# Patient Record
Sex: Female | Born: 1935 | Race: Black or African American | Hispanic: No | Marital: Married | Smoking: Never smoker
Health system: Southern US, Community
[De-identification: ages and names within clinical notes are randomized; demographics above are authoritative.]

## PROBLEM LIST (undated history)

## (undated) DIAGNOSIS — M1990 Unspecified osteoarthritis, unspecified site: Secondary | ICD-10-CM

## (undated) DIAGNOSIS — I10 Essential (primary) hypertension: Secondary | ICD-10-CM

## (undated) DIAGNOSIS — E669 Obesity, unspecified: Secondary | ICD-10-CM

## (undated) DIAGNOSIS — I519 Heart disease, unspecified: Secondary | ICD-10-CM

## (undated) DIAGNOSIS — I119 Hypertensive heart disease without heart failure: Secondary | ICD-10-CM

## (undated) DIAGNOSIS — I4510 Unspecified right bundle-branch block: Secondary | ICD-10-CM

## (undated) DIAGNOSIS — R7303 Prediabetes: Secondary | ICD-10-CM

## (undated) HISTORY — DX: Essential (primary) hypertension: I10

## (undated) HISTORY — DX: Heart disease, unspecified: I51.9

## (undated) HISTORY — DX: Prediabetes: R73.03

## (undated) HISTORY — DX: Unspecified right bundle-branch block: I45.10

## (undated) HISTORY — DX: Hypertensive heart disease without heart failure: I11.9

## (undated) HISTORY — DX: Unspecified osteoarthritis, unspecified site: M19.90

## (undated) HISTORY — DX: Obesity, unspecified: E66.9

---

## 1984-02-07 HISTORY — PX: ABDOMINAL HYSTERECTOMY: SHX81

## 1988-09-14 ENCOUNTER — Encounter (INDEPENDENT_AMBULATORY_CARE_PROVIDER_SITE_OTHER): Payer: Self-pay | Admitting: Gastroenterology

## 1997-05-25 ENCOUNTER — Encounter (INDEPENDENT_AMBULATORY_CARE_PROVIDER_SITE_OTHER): Payer: Self-pay | Admitting: *Deleted

## 1997-12-15 ENCOUNTER — Ambulatory Visit (HOSPITAL_COMMUNITY): Admission: RE | Admit: 1997-12-15 | Discharge: 1997-12-15 | Payer: Self-pay | Admitting: Gastroenterology

## 1997-12-15 ENCOUNTER — Encounter (INDEPENDENT_AMBULATORY_CARE_PROVIDER_SITE_OTHER): Payer: Self-pay | Admitting: *Deleted

## 1997-12-15 ENCOUNTER — Encounter: Payer: Self-pay | Admitting: Gastroenterology

## 1998-01-06 ENCOUNTER — Encounter (INDEPENDENT_AMBULATORY_CARE_PROVIDER_SITE_OTHER): Payer: Self-pay | Admitting: *Deleted

## 1998-03-30 ENCOUNTER — Other Ambulatory Visit: Admission: RE | Admit: 1998-03-30 | Discharge: 1998-03-30 | Payer: Self-pay | Admitting: Gynecology

## 1999-05-03 ENCOUNTER — Other Ambulatory Visit: Admission: RE | Admit: 1999-05-03 | Discharge: 1999-05-03 | Payer: Self-pay | Admitting: Gynecology

## 2000-06-25 ENCOUNTER — Other Ambulatory Visit: Admission: RE | Admit: 2000-06-25 | Discharge: 2000-06-25 | Payer: Self-pay | Admitting: Gynecology

## 2001-10-16 ENCOUNTER — Other Ambulatory Visit: Admission: RE | Admit: 2001-10-16 | Discharge: 2001-10-16 | Payer: Self-pay | Admitting: Gynecology

## 2002-06-05 ENCOUNTER — Encounter: Payer: Self-pay | Admitting: Internal Medicine

## 2002-06-05 ENCOUNTER — Encounter: Admission: RE | Admit: 2002-06-05 | Discharge: 2002-06-05 | Payer: Self-pay | Admitting: Internal Medicine

## 2002-07-30 ENCOUNTER — Encounter (INDEPENDENT_AMBULATORY_CARE_PROVIDER_SITE_OTHER): Payer: Self-pay | Admitting: *Deleted

## 2002-09-30 ENCOUNTER — Encounter (INDEPENDENT_AMBULATORY_CARE_PROVIDER_SITE_OTHER): Payer: Self-pay | Admitting: Gastroenterology

## 2002-09-30 DIAGNOSIS — D126 Benign neoplasm of colon, unspecified: Secondary | ICD-10-CM | POA: Insufficient documentation

## 2002-11-10 ENCOUNTER — Encounter: Payer: Self-pay | Admitting: General Surgery

## 2002-11-13 ENCOUNTER — Inpatient Hospital Stay (HOSPITAL_COMMUNITY): Admission: RE | Admit: 2002-11-13 | Discharge: 2002-11-19 | Payer: Self-pay | Admitting: General Surgery

## 2002-11-13 ENCOUNTER — Encounter (INDEPENDENT_AMBULATORY_CARE_PROVIDER_SITE_OTHER): Payer: Self-pay | Admitting: *Deleted

## 2002-11-13 ENCOUNTER — Encounter (INDEPENDENT_AMBULATORY_CARE_PROVIDER_SITE_OTHER): Payer: Self-pay | Admitting: Specialist

## 2002-11-15 ENCOUNTER — Encounter: Payer: Self-pay | Admitting: General Surgery

## 2003-11-26 ENCOUNTER — Encounter (INDEPENDENT_AMBULATORY_CARE_PROVIDER_SITE_OTHER): Payer: Self-pay | Admitting: *Deleted

## 2003-12-08 ENCOUNTER — Ambulatory Visit: Payer: Self-pay | Admitting: Gastroenterology

## 2004-01-19 ENCOUNTER — Ambulatory Visit: Payer: Self-pay | Admitting: Gastroenterology

## 2004-02-07 HISTORY — PX: COLON SURGERY: SHX602

## 2004-02-09 ENCOUNTER — Ambulatory Visit: Payer: Self-pay | Admitting: Gastroenterology

## 2004-08-15 ENCOUNTER — Encounter: Admission: RE | Admit: 2004-08-15 | Discharge: 2004-08-15 | Payer: Self-pay | Admitting: Internal Medicine

## 2005-01-04 ENCOUNTER — Encounter: Admission: RE | Admit: 2005-01-04 | Discharge: 2005-01-04 | Payer: Self-pay | Admitting: Internal Medicine

## 2005-03-27 ENCOUNTER — Encounter: Admission: RE | Admit: 2005-03-27 | Discharge: 2005-03-27 | Payer: Self-pay | Admitting: Family Medicine

## 2006-02-16 ENCOUNTER — Other Ambulatory Visit: Admission: RE | Admit: 2006-02-16 | Discharge: 2006-02-16 | Payer: Self-pay | Admitting: Family Medicine

## 2006-07-18 ENCOUNTER — Ambulatory Visit: Payer: Self-pay | Admitting: Gastroenterology

## 2006-07-18 LAB — CONVERTED CEMR LAB
ALT: 17 units/L (ref 0–40)
Basophils Relative: 1.2 % — ABNORMAL HIGH (ref 0.0–1.0)
Bilirubin, Direct: 0.1 mg/dL (ref 0.0–0.3)
Calcium: 9.9 mg/dL (ref 8.4–10.5)
Creatinine, Ser: 1 mg/dL (ref 0.4–1.2)
Eosinophils Absolute: 0.2 10*3/uL (ref 0.0–0.6)
Eosinophils Relative: 3 % (ref 0.0–5.0)
Glucose, Bld: 76 mg/dL (ref 70–99)
Hemoglobin: 11.5 g/dL — ABNORMAL LOW (ref 12.0–15.0)
Lymphocytes Relative: 35.8 % (ref 12.0–46.0)
MCHC: 34.3 g/dL (ref 30.0–36.0)
MCV: 89.6 fL (ref 78.0–100.0)
Monocytes Absolute: 0.4 10*3/uL (ref 0.2–0.7)
Monocytes Relative: 8.3 % (ref 3.0–11.0)
Neutro Abs: 2.6 10*3/uL (ref 1.4–7.7)
Neutrophils Relative %: 51.7 % (ref 43.0–77.0)
Potassium: 4.2 meq/L (ref 3.5–5.1)
RBC: 3.73 M/uL — ABNORMAL LOW (ref 3.87–5.11)
RDW: 12.6 % (ref 11.5–14.6)
Sodium: 144 meq/L (ref 135–145)

## 2006-07-26 ENCOUNTER — Ambulatory Visit (HOSPITAL_COMMUNITY): Admission: RE | Admit: 2006-07-26 | Discharge: 2006-07-26 | Payer: Self-pay | Admitting: Gastroenterology

## 2006-08-27 ENCOUNTER — Ambulatory Visit: Payer: Self-pay | Admitting: Internal Medicine

## 2007-04-26 HISTORY — PX: OTHER SURGICAL HISTORY: SHX169

## 2007-04-26 HISTORY — PX: US ECHOCARDIOGRAPHY: HXRAD669

## 2007-05-22 DIAGNOSIS — K5900 Constipation, unspecified: Secondary | ICD-10-CM | POA: Insufficient documentation

## 2007-05-22 DIAGNOSIS — K589 Irritable bowel syndrome without diarrhea: Secondary | ICD-10-CM | POA: Insufficient documentation

## 2008-04-30 ENCOUNTER — Telehealth: Payer: Self-pay | Admitting: Internal Medicine

## 2008-05-04 ENCOUNTER — Ambulatory Visit: Payer: Self-pay | Admitting: Internal Medicine

## 2008-05-18 ENCOUNTER — Ambulatory Visit: Payer: Self-pay | Admitting: Internal Medicine

## 2008-05-26 ENCOUNTER — Ambulatory Visit: Payer: Self-pay | Admitting: Internal Medicine

## 2008-06-04 ENCOUNTER — Ambulatory Visit: Payer: Self-pay | Admitting: Internal Medicine

## 2008-06-04 DIAGNOSIS — R1084 Generalized abdominal pain: Secondary | ICD-10-CM | POA: Insufficient documentation

## 2008-06-05 ENCOUNTER — Telehealth (INDEPENDENT_AMBULATORY_CARE_PROVIDER_SITE_OTHER): Payer: Self-pay | Admitting: *Deleted

## 2008-06-05 LAB — CONVERTED CEMR LAB: UREASE: POSITIVE

## 2009-05-18 ENCOUNTER — Telehealth: Payer: Self-pay | Admitting: Internal Medicine

## 2009-05-19 ENCOUNTER — Ambulatory Visit: Payer: Self-pay | Admitting: Gastroenterology

## 2009-05-19 DIAGNOSIS — Z860100 Personal history of colon polyps, unspecified: Secondary | ICD-10-CM | POA: Insufficient documentation

## 2009-05-19 DIAGNOSIS — I2510 Atherosclerotic heart disease of native coronary artery without angina pectoris: Secondary | ICD-10-CM | POA: Insufficient documentation

## 2009-05-19 DIAGNOSIS — I10 Essential (primary) hypertension: Secondary | ICD-10-CM

## 2009-05-19 DIAGNOSIS — R1013 Epigastric pain: Secondary | ICD-10-CM

## 2009-06-17 ENCOUNTER — Telehealth (INDEPENDENT_AMBULATORY_CARE_PROVIDER_SITE_OTHER): Payer: Self-pay | Admitting: *Deleted

## 2010-03-08 NOTE — Assessment & Plan Note (Signed)
Summary: epigastric pain-Dr.Perry pt   History of Present Illness Visit Type: Follow-up Visit Primary GI MD: Yancey Flemings MD Primary Provider: Leland Her, MD Chief Complaint: epigastric pain off/on since EGD History of Present Illness:   76 YO FEMALE  KNOWN TO DR. PERRY WITH HX OF GASTRITIS-H.PYLORI + ONE YEAR AGO. TREATED WITH A PREVPAK. ALSO HAD COLONOSCOPY AT THAT SAME TIME  WHICH WAS NORMAL WITH A FAIR PREP. SHE HAS HX OF LAP RIGHT HEMICOLECTOMY 2004 FOR A LARGE VILLOUS ADENOMA. SHE COMES IN TODAY WITH C/O INTERMITTENT EPIGASTRIC GNAWING AND BURNING OVER THE PAST COUPLE MONTHS. SHE WAS WAKENED FROM SLEEP WITH PAIN YESTERDAY MORNING-ATE VERY BLAND ALL DAY AND FEELS A LITTLE BETTER TODAY. NO N/V. NO CHANGE IN BOWEL HABITS, NO MELENA OR HEME.   GI Review of Systems    Reports abdominal pain and  belching.     Location of  Abdominal pain: epigastric area.    Denies acid reflux, bloating, chest pain, dysphagia with liquids, dysphagia with solids, heartburn, loss of appetite, nausea, vomiting, vomiting blood, and  weight loss.        Denies anal fissure, black tarry stools, change in bowel habit, constipation, diarrhea, diverticulosis, fecal incontinence, heme positive stool, hemorrhoids, irritable bowel syndrome, jaundice, light color stool, liver problems, rectal bleeding, and  rectal pain. Preventive Screening-Counseling & Management  Alcohol-Tobacco     Smoking Status: never      Drug Use:  no.      Current Medications (verified): 1)  Micardis Hct 80-12.5 Mg Tabs (Telmisartan-Hctz) .... Once Daily 2)  Demadex 20 Mg Tabs (Torsemide) .... Once Daily 3)  Cardura 8 Mg Tabs (Doxazosin Mesylate) .... Every Evening 4)  Clonidine Hcl 0.1 Mg Tabs (Clonidine Hcl) .... Every Evening 5)  Glucosamine-Chondroitin-Vit D3 1500-1200-800 Mg-Mg-Unit Pack (Glucosamine-Chondroitin-Vit D3) .... Take 2 Tablets Daily 6)  Fish Oil 1500 Mg .... Take 2 Tablets Daily 7)  Multivitamins  Tabs (Multiple  Vitamin) .... Once Daily 8)  Vitamin C Cr 500 Mg Cr-Tabs (Ascorbic Acid) .... Once Daily 9)  Klor-Con M20 20 Meq Cr-Tabs (Potassium Chloride Crys Cr) .... Once Daily 10)  Calcium 800mg  Plus Magnesium .... Take 2 Tablets Daily 11)  Vitamin D 1000mg  .... Once Daily 12)  Aspir-Low 81 Mg Tbec (Aspirin) .... Once Daily  Allergies (verified): No Known Drug Allergies  Past History:  Past Medical History: Current Problems:  Family Hx of MALIGNANT NEOPLASM OF COLON UNSPECIFIED SITE (ICD-153.9) CONSTIPATION (ICD-564.00) IRRITABLE BOWEL SYNDROME (ICD-564.1) TUBULOVILLOUS ADENOMA, COLON (ICD-211.3)-REQUIRED SURGICAL REMOVAL-RIGHT HEMICOLECTOMY 2004 H.PYLORI GASTRITIS 2010  Past Surgical History: Hysterectomy Appendectomy LAP RIGHT HEMICOLECTOMY 2004-LARGE VILLOUS ADENOMA  Family History: Family History of Colon Cancer:Sister  Social History: Occupation:  Patient has never smoked.  Alcohol Use - yes Daily Caffeine Use Illicit Drug Use - no Smoking Status:  never Drug Use:  no  Review of Systems       The patient complains of arthritis/joint pain.  The patient denies allergy/sinus, anemia, back pain, blood in urine, breast changes/lumps, change in vision, confusion, cough, coughing up blood, depression-new, fainting, fatigue, fever, headaches-new, hearing problems, heart murmur, heart rhythm changes, itching, menstrual pain, muscle pains/cramps, night sweats, nosebleeds, pregnancy symptoms, shortness of breath, skin rash, sleeping problems, sore throat, swelling of feet/legs, swollen lymph glands, thirst - excessive, urination - excessive, and urination changes/pain.         OTHERWISE AS IN HPI  Vital Signs:  Patient profile:   76 year old female Height:      72  inches Weight:      192.25 pounds BMI:     32.11 Pulse rate:   68 / minute Pulse rhythm:   regular BP sitting:   120 / 74  (left arm) Cuff size:   regular  Vitals Entered By: June McMurray CMA Duncan Dull) (May 19, 2009  10:28 AM)  Physical Exam  General:  Well developed, well nourished, no acute distress. Head:  Normocephalic and atraumatic. Eyes:  PERRLA, no icterus. Lungs:  Clear throughout to auscultation. Heart:  Regular rate and rhythm; no murmurs, rubs,  or bruits. Abdomen:  SOFT, MINIMALLY TENDER EPIGASTRIUM,NO GUARDING, NO REBOUND ,NO MASS OR HSM,BS+ Rectal:  NOT DONE Extremities:  No clubbing, cyanosis, edema or deformities noted. Neurologic:  Alert and  oriented x4;  grossly normal neurologically. Psych:  Alert and cooperative. Normal mood and affect.   Impression & Recommendations:  Problem # 1:  ABDOMINAL PAIN-EPIGASTRIC (ICD-789.06) Assessment New 76 YO FEMALE  WITH 2 MONTH HX OF EPIGASTRIC DISCOMFORT,BURNING,GNAWING-WORSE PAST COUPLE DAYS-SXS CONSITENT WITH GASTRITIS-PROBABLY RELATED TO INTERMITTENT NSAIDS AND DAILY BABY ASA WHICH SHE HAS BEEN TAKING AT NIGHT.  STOP IBUPROFEN TAKE ASA IN THE MORNING WITH FOOD START ACIPHEX 20 MG DAILY IN AM-SAMPLES GIVEN-IF INSURANCE WILL NOT COVER CAN CONTINUE WITH OMEPRAZOLE 20 MG DAILY IN AM. FOLLOW UP WITH DR. PERRY IN 3-4 WEEKS IF SXS ARE PERSISTING.  Problem # 2:  FAMILY HX COLON CANCER (ICD-V16.0) Assessment: Comment Only NORMAL COLON 4/10  Problem # 3:  PERSONAL HX COLONIC POLYPS (ICD-V12.72) Assessment: Unchanged LARGE TUBOVILLOUS POLYP REMOVED WITH RIGHT HEMICOLECTOMY  2004.  Problem # 4:  HYPERTENSION (ICD-401.9)  Patient Instructions: 1)  We have given you samples of Aciphex . Take 1 tab 30 min before breakfast.  Due to cost, the Aciphex is not preferred with your Insurance plan so you can get Over the Counter Prilosec at the pharmacy or [facility] to take 1 daily before breakfast. 2)  We made you an appointment with Dr. Marina Goodell for 06-21-09 at 4:00 PM.   3)  Copy sent to :  Leland Her, MD 4)  The medication list was reviewed and reconciled.  All changed / newly prescribed medications were explained.  A complete medication list was  provided to the patient / caregiver.

## 2010-03-08 NOTE — Progress Notes (Signed)
Summary: Aciphex worked Water quality scientist good.  Phone Note Call from Patient Call back at Home Phone [phone]   Call For: Dr Marina Goodell Summary of Call: Cancelling her appoinment on Mon 06-21-09 because the aciphex worked wonderful and has not had a problem since. Is very happy and wanted to take a minute and let Doctor know. Initial call taken by: Leanor Kail [facility],  Jun 17, 2009 11:43 AM  Follow-up for Phone Call        Pt. has not had any epigastric pain since Aciphex started.Instructed to call back if  symptoms re-occur.Otherwise she will come for yearly check-up.  Follow-up by: Teryl Lucy RN,  Jun 17, 2009 1:53 PM  Additional Follow-up for Phone Call Additional follow up Details #1::        ok Additional Follow-up by: Hilarie Fredrickson MD,  Jun 21, 2009 7:54 AM

## 2010-03-08 NOTE — Progress Notes (Signed)
Summary: triage / abdominal pain  Phone Note Call from Patient Call back at [phone]   Caller: Patient Call For: Dr. Marina Goodell Reason for Call: Talk to Nurse Complaint: Breathing Problems Summary of Call: "my stomach is hurting so"... Sunday abd felt "numb" and last night abd started hurting Initial call taken by: Vallarie Mare,  May 18, 2009 12:27 PM  Follow-up for Phone Call        Started yesterday with burning epigastric pain which during the night was a level 6 and is now a level 4. Improves with soft bland foods but aggravated by  fresh vegetables and fruits only other symptom is belching. Follow-up by: Teryl Lucy RN,  May 18, 2009 12:37 PM  Additional Follow-up for Phone Call Additional follow up Details #1::        start her on a daily PPI and set her up to see the extender this week. If her problem worsens significantly in the interim, she should go to the ER Additional Follow-up by: Hilarie Fredrickson MD,  May 18, 2009 2:26 PM    Additional Follow-up for Phone Call Additional follow up Details #2::    Pt. given appt. with tomorrow am will discuss medication then. Follow-up by: Teryl Lucy RN,  May 18, 2009 3:18 PM

## 2010-03-08 NOTE — Progress Notes (Signed)
Summary: [employer] [employer]   Imported By: Sherian Rein 06/22/2009 10:57:20  _____________________________________________________________________  External Attachment:    Type:   Image     Comment:   External Document

## 2010-03-08 NOTE — Progress Notes (Signed)
Summary: [facility] [employer]   Imported By: Sherian Rein 06/22/2009 10:53:35  _____________________________________________________________________  External Attachment:    Type:   Image     Comment:   External Document

## 2010-03-11 NOTE — Progress Notes (Signed)
Summary: [facility] [employer]   Imported By: Sherian Rein 06/22/2009 10:56:09  _____________________________________________________________________  External Attachment:    Type:   Image     Comment:   External Document

## 2010-03-11 NOTE — Progress Notes (Signed)
Summary: [facility] [employer] [facility]   Imported By: Sherian Rein 06/22/2009 10:54:47  _____________________________________________________________________  External Attachment:    Type:   Image     Comment:   External Document

## 2010-06-21 NOTE — Assessment & Plan Note (Signed)
[facility]                         GASTROENTEROLOGY OFFICE NOTE   NAME:Kaitlin [name]                          MRN:          [id number]  DATE:08/27/2006                            DOB:          January 14, 1935    HISTORY OF PRESENT ILLNESS:  Kaitlin [name] is a 76 year old female who has  been followed by Dr. Victorino Dike for constipation predominant irritable  bowel syndrome and colon polyps.  She has known Dr. Corinda Gubler for many  years.  The last evaluation with him was July 18, 2006.  She is status  post right hemicolectomy for large adenomatous colon polyp which not  amenable to endoscopic removal.  She does have family history of colon  cancer.  Her resection was in 2004.  Her last colonoscopy was in 2006  and was negative except for diminutive polyps.  Follow up in four years  recommended.  At the time of her recent visit with Dr.  Corinda Gubler, she was  felt to have abdominal discomfort due to chronic constipation as well as  significant anxiety over her symptoms.  Because of this, he ordered a CT  scan of the abdomen and pelvis.  This was essentially normal.  They did  raise the question of SMA syndrome.  However, this is a well-nourished,  well-developed female with no weight loss or obstructive symptoms.  Dr.  Corinda Gubler recommended treating her with five prunes per day.  Since she  has done this, her bowel habits have been regular and her symptoms have  improved.  She is also relieved that a CT scan was unrevealing.   CURRENT MEDICATIONS:  As listed in the chart.  These have been reviewed.   PHYSICAL EXAMINATION:  GENERAL:  Well-appearing female in no acute  distress.  VITAL SIGNS:  Blood pressure 122/84, heart rate 72, weight 195 pounds.  She is 5 feet 5 inches in height.  HEENT:  Sclerae anicteric.  LUNGS:  Clear.  HEART:  Regular.  ABDOMEN:  Soft without tenderness, mass or hernia.  Good bowel sounds.  EXTREMITIES:  Without edema.   IMPRESSION:  1.  Constipation predominant irritable bowel syndrome.  2. History of large adenomatous polyp of the right colon status post      right hemicolectomy.  3. General medical problems under the care of Dr. Tiburcio Pea.   RECOMMENDATIONS:  1. Continue prunes for bowels.  2. Follow up colonoscopy planned in January 2010.  3. Interval GI followup p.r.n.     Wilhemina Bonito. Marina Goodell, MD  Electronically Signed    JNP/MedQ  DD: 08/27/2006  DT: 08/27/2006  Job #: [id number]   cc:   Holley Bouche, M.D.

## 2010-06-21 NOTE — Assessment & Plan Note (Signed)
[facility]                         GASTROENTEROLOGY OFFICE NOTE   NAME:Kaitlin [name], Kaitlin [name]                          MRN:          [id number]  DATE:07/18/2006                            DOB:          01-16-1935    Ache in the lower abdomen, mainly in the mornings.  Yearly checkup.  History of multiple operations in the past.  Has a slow, constant aching  since her colectomy.  Says she has difficulty with bowel activities.  Her symptoms are thought related to constipation.  Her last colonoscopic  examination was in January 2006.  She did have multiple very small,  diminutive polyps, nothing else of significance.  She has a family  history of colon cancer and I think this worries her, as well as she has  had a segmental resection of her right colon for villous adenoma in this  region.  She is anxious about all this and I think this is what  precipitated her visit.   In any case, on physical examination, she weighed 188, blood pressure  110/68, pulse 60 and regular.  Neck, heart and extremities is  unremarkable.   IMPRESSION:  Status post colon polyps as described with one removed at  segmental colectomy.   RECOMMENDATIONS:  CT of her abdomen and pelvis to be complete or to try  to explain her symptoms or reason that she would not have to worry or  for reassurance because of the chronic pain that she describes.  Then  she can follow with one of my colleagues after my retirement.     Ulyess Mort, MD  Electronically Signed    SML/MedQ  DD: 07/18/2006  DT: 07/18/2006  Job #: [id number]

## 2010-06-24 NOTE — Op Note (Signed)
NAME:  Kaitlin [name], Kaitlin [name]                             ACCOUNT NO.:  [id number]   MEDICAL RECORD NO.:  [id number]                   PATIENT TYPE:  INP   LOCATION:  X001                                 FACILITY:  [facility]   PHYSICIAN:  Adolph Pollack, M.D.            DATE OF BIRTH:  Aug 26, 1934   DATE OF PROCEDURE:  11/13/2002  DATE OF DISCHARGE:                                 OPERATIVE REPORT   PREOPERATIVE DIAGNOSIS:  Large tubulovillous adenoma of right colon.   POSTOPERATIVE DIAGNOSIS:  Large tubulovillous adenoma of right colon.   PROCEDURE:  Laparoscopic right colectomy.   SURGEON:  Adolph Pollack, M.D.   ASSISTANT:  Gabrielle Dare. Janee Morn, M.D.   ANESTHESIA:  General.   INDICATIONS FOR PROCEDURE:  Kaitlin [name] is a 76 year old female whose sister  was recently diagnosed with colon cancer. She had a screening colonoscopy by  Dr. Corinda Gubler and a large polypoid lesion was seen near the cecum and proximal  right colon. It could not be removed colonoscopically. Biopsy demonstrated  tubulovillous adenoma and she now presents for partial colectomy. The  procedure and the risks were explained to her preoperatively.   TECHNIQUE:  She is seen in the holding area and then brought to the  operating room, placed supine on the operating table and a general  anesthetic was administered. A Foley catheter was placed in the bladder.  Her abdomen was sterilely prepped and draped. A small incision was made in  the epigastrium and carried down through the subcutaneous tissue and a small  incision made in the fascia and peritoneal cavity. A pursestring suture of  #0 Vicryl was placed around the fascial area. A Hasson trocar was introduced  into the peritoneal cavity and pneumoperitoneum created by insufflation of  CO2 gas.   Next, a laparoscope was introduced and under direct vision a 10 mm trocar  was placed in the left mid abdomen mid clavicular line. A 5 mm trocar was  then placed in the lower  midline. I grasped the right colon and using the  harmonic scalpel, I divided the white line of Toldt and began medializing  the right colon. I identified the area of the ureter and kept the dissection  above this. The terminal ileum was fairly mobile. I then used the harmonic  scalpel to divide the attachments in the hepatic flexure and proximal  transverse colon and mobilized this. By the time I had this mobilized, I  could retract this all the way down to the pelvis. I then made an extraction  incision through a previous Pfannenstiel incision using only part of it  entering the peritoneal cavity. I was able to grasp the cecum in the distal  ileum and bring all this out including the proximal through the transverse  colon. I divided the distal ileum with the GIA stapler and then divided the  transverse colon at its junction  with the proximal 1/3 and distal 2/3 which  was proximal to the middle colic vessels. Once I had done this, I then  divided the mesentery in a wedge shape to include lymph nodes and vessels  and ligated the vessels with ties. I took the specimen off the field, opened  it up and found a large tubulovillous lesion in the proximal right colon.   Next gloves were change. I then approximated two ends of the bowel together  in a side by side fashion and performed a side by side stapled anastomosis.  I closed the remaining enterotomy with a TA60 noncutting stapler. The  anastomosis was patent, viable and under no tension. The distal most staple  line anteriorly was reinforced with a 2-0 Vicryl suture. The anastomosis was  then dropped back into the abdominal cavity.   Next, I irrigated out the area with the warm normal saline solution. I then  closed the peritoneum with a running 2-0 Vicryl suture. The fascia was  closed with a running #1 PDS suture and the subcutaneous tissue was  irrigated.   The abdomen was reinsufflated and the laparoscope reintroduced. There was  one  area at the mesentery that I had identified as possibly having a vessel  was not ligated well. I identified this by way of the suture we used and  then used a Vicryl endoloop to further ligate this area. I then removed the  suture. Hemostasis was adequate and anastomosis was patent. I saw no liver  lesions and no suspicious lesions in the pelvis. The fascial closure was  found to be solid inferiorly.   The trocars were then removed, the epigastric fascial defect closed by  tightening up and tying down the pursestring suture and the skin incisions  closed with staples. Sterile dressings were applied.   She tolerated the procedure well without any apparent complications. She  subsequently was taken to the recovery room, extubated and in satisfactory  condition.                                               Adolph Pollack, M.D.    Kari Baars  D:  11/13/2002  T:  11/13/2002  Job:  [id number]   cc:   Sharlet Salina, M.D.  [address]  [place]  [place] [address]  Fax: [phone]   Ulyess Mort, M.D. [facility]   Darlin Priestly, M.D.  [address], [address]  [place]  [place] [address]  Fax: [phone]

## 2010-06-24 NOTE — Discharge Summary (Signed)
   NAME:  Kaitlin [name], Kaitlin [name]                             ACCOUNT NO.:  [id number]   MEDICAL RECORD NO.:  [id number]                   PATIENT TYPE:  INP   LOCATION:  0365                                 FACILITY:  [facility]   PHYSICIAN:  Adolph Pollack, M.D.            DATE OF BIRTH:  10/25/1934   DATE OF ADMISSION:  11/13/2002  DATE OF DISCHARGE:  11/19/2002                                 DISCHARGE SUMMARY   PRINCIPAL DISCHARGE DIAGNOSIS:  Tubulovillous adenoma of the right colon.   SECONDARY DIAGNOSES:  1. Hypertension.  2. Arthritis.  3. Congestive heart failure.   PROCEDURE:  Laparoscopic right colectomy November 13, 2002.   HISTORY:  Ms. Kaitlin [name] is a 76 year old female with a family history of colon  cancer.  Colon cancer screening by way of colonoscopy was done and a villous  type polypoid lesion was found in the cecal area.  It could not be  completely removed.  She is admitted for elective resection.   HOSPITAL COURSE:  She underwent a laparoscopic right colectomy.  Postoperatively, she had some mild congestive heart failure which was  treated with adequate diuresis.  She had return of some bowel function and  was started on a liquid-type diet.  Hypertension was controlled with her  home antihypertensives.  She had some coughing and was given some  Robitussin; this controlled her cough well.  She also had some hypokalemia  following her diuresis but this was replaced with potassium.   By postoperative day #6, she was feeling much better and her cough was  resolved.  Bowels were moving, she is tolerating a diet, congestive heart  failure was resolved, her staples were removed, and she was ready for  discharge.    DISPOSITION:  Discharged to home in satisfactory condition on November 19, 2002.  She is told to continue her home medications and take Tylox for pain.  Activity restrictions and dietary recommendations were given to her.  She  will return to see me in 2-3 weeks in the  office for followup.  Of note was  that the pathology was consistent with a benign colonic tubulovillous  adenoma on final report.                                               Adolph Pollack, M.D.    Kari Baars  D:  11/28/2002  T:  11/28/2002  Job:  [id number]   cc:   Ulyess Mort, M.D. [facility]   Sharlet Salina, M.D.  [address]  [place]  [place] [address]  Fax: [phone]   Darlin Priestly, M.D.  [phone] [address], [address]  [place]  [place] [address]  Fax: [phone]

## 2010-07-18 ENCOUNTER — Telehealth: Payer: Self-pay | Admitting: Internal Medicine

## 2010-07-18 MED ORDER — OMEPRAZOLE 20 MG PO CPDR: 20.0000 mg | DELAYED_RELEASE_CAPSULE | Freq: Two times a day (BID) | ORAL | Status: DC

## 2010-07-18 NOTE — Telephone Encounter (Signed)
Pt aware of Dr. Lamar Sprinkles recommendations and rx sent to the pharmacy.

## 2010-07-18 NOTE — Telephone Encounter (Signed)
Left message for pt to call back  °

## 2010-07-18 NOTE — Telephone Encounter (Signed)
Pt states she is having epigastric pain like she has had in the past. Pt states it is worse at night and that it wakes her up. She describes it as an irritation. Pt was given Aciphex 20mg  in the past for this along with Omeprazole 20mg  in the am. Dr. Marina Goodell please advise.

## 2010-07-18 NOTE — Telephone Encounter (Signed)
She can increase omeprazole to twice a day. If symptoms persist despite this, she may need upper endoscopy

## 2011-02-07 HISTORY — PX: CATARACT EXTRACTION W/ INTRAOCULAR LENS  IMPLANT, BILATERAL: SHX1307

## 2011-03-06 ENCOUNTER — Other Ambulatory Visit: Payer: Self-pay | Admitting: Family Medicine

## 2011-03-06 ENCOUNTER — Ambulatory Visit
Admission: RE | Admit: 2011-03-06 | Discharge: 2011-03-06 | Disposition: A | Discharge status: Home / Self Care | Payer: Medicare Other | Source: Ambulatory Visit | Attending: Family Medicine | Admitting: Family Medicine

## 2011-03-06 DIAGNOSIS — I10 Essential (primary) hypertension: Secondary | ICD-10-CM

## 2011-03-15 ENCOUNTER — Encounter: Payer: Self-pay | Admitting: Internal Medicine

## 2011-06-02 ENCOUNTER — Encounter: Payer: Self-pay | Admitting: Internal Medicine

## 2011-06-19 ENCOUNTER — Ambulatory Visit (AMBULATORY_SURGERY_CENTER): Payer: Medicare Other | Admitting: *Deleted

## 2011-06-19 VITALS — Ht 65.0 in | Wt 195.0 lb

## 2011-06-19 DIAGNOSIS — Z1211 Encounter for screening for malignant neoplasm of colon: Secondary | ICD-10-CM

## 2011-06-19 DIAGNOSIS — Z86010 Personal history of colonic polyps: Secondary | ICD-10-CM

## 2011-06-19 MED ORDER — PEG-KCL-NACL-NASULF-NA ASC-C 100 G PO SOLR: ORAL | Status: DC

## 2011-07-10 ENCOUNTER — Encounter: Payer: Self-pay | Admitting: Internal Medicine

## 2011-07-10 ENCOUNTER — Ambulatory Visit (AMBULATORY_SURGERY_CENTER): Payer: Medicare Other | Admitting: Internal Medicine

## 2011-07-10 VITALS — BP 147/85 | HR 54 | Temp 97.3°F | Resp 20 | Ht 65.0 in | Wt 195.0 lb

## 2011-07-10 DIAGNOSIS — Z800 Family history of malignant neoplasm of digestive organs: Secondary | ICD-10-CM

## 2011-07-10 DIAGNOSIS — Z1211 Encounter for screening for malignant neoplasm of colon: Secondary | ICD-10-CM

## 2011-07-10 DIAGNOSIS — Z86010 Personal history of colonic polyps: Secondary | ICD-10-CM

## 2011-07-10 MED ORDER — SODIUM CHLORIDE 0.9 % IV SOLN: 500.0000 mL | INTRAVENOUS | Status: DC

## 2011-07-10 NOTE — Patient Instructions (Signed)
YOU HAD AN ENDOSCOPIC PROCEDURE TODAY AT THE [facility]: Refer to the procedure report that was given to you for any specific questions about what was found during the examination.  If the procedure report does not answer your questions, please call your gastroenterologist to clarify.  If you requested that your care partner not be given the details of your procedure findings, then the procedure report has been included in a sealed envelope for you to review at your convenience later.  YOU SHOULD EXPECT: Some feelings of bloating in the abdomen. Passage of more gas than usual.  Walking can help get rid of the air that was put into your GI tract during the procedure and reduce the bloating. If you had a lower endoscopy (such as a colonoscopy or flexible sigmoidoscopy) you may notice spotting of blood in your stool or on the toilet paper. If you underwent a bowel prep for your procedure, then you may not have a normal bowel movement for a few days.  DIET: Your first meal following the procedure should be a light meal and then it is ok to progress to your normal diet.  A half-sandwich or bowl of soup is an example of a good first meal.  Heavy or fried foods are harder to digest and may make you feel nauseous or bloated.  Likewise meals heavy in dairy and vegetables can cause extra gas to form and this can also increase the bloating.  Drink plenty of fluids but you should avoid alcoholic beverages for 24 hours.  ACTIVITY: Your care partner should take you home directly after the procedure.  You should plan to take it easy, moving slowly for the rest of the day.  You can resume normal activity the day after the procedure however you should NOT DRIVE or use heavy machinery for 24 hours (because of the sedation medicines used during the test).    SYMPTOMS TO REPORT IMMEDIATELY: A gastroenterologist can be reached at any hour.  During normal business hours, 8:30 AM to 5:00 PM Monday through Friday,  call [phone].  After hours and on weekends, please call the GI answering service at [phone] who will take a message and have the physician on call contact you.   Following lower endoscopy (colonoscopy or flexible sigmoidoscopy):  Excessive amounts of blood in the stool  Significant tenderness or worsening of abdominal pains  Swelling of the abdomen that is new, acute  Fever of 100F or higher  FOLLOW UP: If any biopsies were taken you will be contacted by phone or by letter within the next 1-3 weeks.  Call your gastroenterologist if you have not heard about the biopsies in 3 weeks.  Our staff will call the home number listed on your records the next business day following your procedure to check on you and address any questions or concerns that you may have at that time regarding the information given to you following your procedure. This is a courtesy call and so if there is no answer at the home number and we have not heard from you through the emergency physician on call, we will assume that you have returned to your regular daily activities without incident.  SIGNATURES/CONFIDENTIALITY: You and/or your care partner have signed paperwork which will be entered into your electronic medical record.  These signatures attest to the fact that that the information above on your After Visit Summary has been reviewed and is understood.  Full responsibility of the confidentiality of this   discharge information lies with you and/or your care-partner.  Resume medications. 

## 2011-07-10 NOTE — Progress Notes (Signed)
Patient did not experience any of the following events: a burn prior to discharge; a fall within the facility; wrong site/side/patient/procedure/implant event; or a hospital transfer or hospital admission upon discharge from the facility. (G8907) Patient did not have preoperative order for IV antibiotic SSI prophylaxis. (G8918)  

## 2011-07-10 NOTE — Op Note (Signed)
[facility] [address] [place], [place]  [address]  COLONOSCOPY PROCEDURE REPORT  PATIENT:  Kaitlin [name], Kaitlin [name]  MR#:  [id number] BIRTHDATE:  [birth year], 76 yrs. old  GENDER:  female ENDOSCOPIST:  Wilhemina Bonito. Eda Keys, MD REF. BY:  Surveillance Program Recall, PROCEDURE DATE:  07/10/2011 PROCEDURE:  Surveillance Colonoscopy ASA CLASS:  Class II INDICATIONS:  history of pre-cancerous (adenomatous) colon polyps, family history of colon cancer, surveillance and high-risk screening ; laproscopic assisted right hemicolectomy 2004 for large TVA; f/u 2006,2010 MEDICATIONS:   MAC sedation, administered by CRNA, propofol (Diprivan) 220 mg IV  DESCRIPTION OF PROCEDURE:   After the risks benefits and alternatives of the procedure were thoroughly explained, informed consent was obtained.  Digital rectal exam was performed and revealed no abnormalities.   The LB CF-[id number] endoscope was introduced through the anus and advanced to the anastomosis, without limitations.  The quality of the prep was good, using MoviPrep.  The instrument was then slowly withdrawn as the colon was fully examined. <<PROCEDUREIMAGES>>  FINDINGS:  The right colon was surgically resected and an ileo-colonic anastamosis was seen.  Otherwise normal colonoscopy without other polyps, masses, vascular ectasias, or inflammatory changes.   Retroflexed views in the rectum revealed no abnormalities.    The time to anastomosis   = 4:17  minutes. The scope was then withdrawn in   7:19  minutes  and the procedure completed.  COMPLICATIONS:  None  ENDOSCOPIC IMPRESSION: 1) Prior right hemi-colectomy 2) Otherwise normal colonoscopy  RECOMMENDATIONS: 1) Follow up colonoscopy in 5 years  ______________________________ Wilhemina Bonito. Eda Keys, MD  CC:  The Patient;   Holley Bouche, MD  n. eSIGNED:   Wilhemina Bonito. Eda Keys at 07/10/2011 11:58 AM  Milda Smart, [phone]

## 2011-07-11 ENCOUNTER — Telehealth: Payer: Self-pay | Admitting: *Deleted

## 2011-07-11 NOTE — Telephone Encounter (Signed)
  Follow up Call-  Call back number 07/10/2011  Post procedure Call Back phone  # [phone]  Permission to leave phone message Yes     Patient questions:  Do you have a fever, pain , or abdominal swelling? no Pain Score  0 *  Have you tolerated food without any problems? yes  Have you been able to return to your normal activities? yes  Do you have any questions about your discharge instructions: Diet   no Medications  no Follow up visit  no  Do you have questions or concerns about your Care? no  Actions: * If pain score is 4 or above: No action needed, pain <4.

## 2012-05-18 ENCOUNTER — Encounter: Payer: Self-pay | Admitting: *Deleted

## 2012-05-20 ENCOUNTER — Encounter: Payer: Self-pay | Admitting: Cardiovascular Disease

## 2012-05-21 ENCOUNTER — Other Ambulatory Visit: Payer: Self-pay | Admitting: Family Medicine

## 2012-05-21 DIAGNOSIS — M549 Dorsalgia, unspecified: Secondary | ICD-10-CM

## 2012-05-29 ENCOUNTER — Ambulatory Visit
Admission: RE | Admit: 2012-05-29 | Discharge: 2012-05-29 | Disposition: A | Discharge status: Home / Self Care | Payer: Medicare Other | Source: Ambulatory Visit | Attending: Family Medicine | Admitting: Family Medicine

## 2012-05-29 DIAGNOSIS — M549 Dorsalgia, unspecified: Secondary | ICD-10-CM

## 2013-07-16 ENCOUNTER — Telehealth: Payer: Self-pay | Admitting: Cardiovascular Disease

## 2013-07-16 NOTE — Telephone Encounter (Signed)
New Prob    Pt is requesting to speak to a nurse. She did not leave any details regarding call.

## 2013-07-16 NOTE — Telephone Encounter (Signed)
LMTCB

## 2013-09-11 ENCOUNTER — Ambulatory Visit (INDEPENDENT_AMBULATORY_CARE_PROVIDER_SITE_OTHER): Payer: Medicare Other | Admitting: Cardiovascular Disease

## 2013-09-11 ENCOUNTER — Encounter: Payer: Self-pay | Admitting: Cardiovascular Disease

## 2013-09-11 VITALS — BP 150/84 | HR 58 | Resp 16 | Ht 65.0 in | Wt 197.6 lb

## 2013-09-11 DIAGNOSIS — I10 Essential (primary) hypertension: Secondary | ICD-10-CM

## 2013-09-11 NOTE — Assessment & Plan Note (Addendum)
Control is excellent. She is on an unusual combination of alpha blocker and clonidine in addition to diuretics and ARB. This has worked well for her. Avoid calcium channel blockers since these may worsen her edema. Avoid beta blockers do to baseline bradycardia and right bundle branch block. I think her lower showed edema is due to a combination of venous insufficiency and possibly mild diastolic dysfunction. She is encouraged to lose weight and avoid excessive consumption of sweets and starches since she has borderline diabetes mellitus. She has excellent tolerance to physical activity by her report.

## 2013-09-11 NOTE — Progress Notes (Signed)
Patient ID: Kaitlin [name], female   DOB: 01/14/1935, 79 y.o.   MRN: [id number]      Reason for office visit Hypertensive heart disease, mild diastolic dysfunction, venous insufficiency  Kaitlin [name] is a 79 year old woman with long-standing systemic hypertension, left ventricular hypertrophy, mild diastolic dysfunction, chronic right bundle-branch block,obesity, borderline diabetes mellitus and chronic complaints of lower extremity edema related in part to venous insufficiency. Her last echocardiogram was performed in 2009 it showed normal LV ejection fraction and impaired left ventricular relaxation. The left atrium is mildly dilated. She had a nuclear stress test performed in 2009 with normal results.  She is very active. She was recently on a drip to [place] with her daughter was able to walk 15 blocks without stopping. She did notice swelling in her legs with prolonged orthostasis, but not while she was walking. She has been on the same antihypertensive medication with good results for years. Her typical blood pressure at home is in the 130s over 70s. She had blood work done with her primary care physician, Dr. Mickel Crow earlier this spring. The results were good except for borderline elevation in hemoglobin A1c. She denies problems with shortness of breath with activity, chest discomfort, palpitations or dizziness.  No Known Allergies  Current Outpatient Prescriptions  Medication Sig Dispense Refill  . aspirin 81 MG tablet Take 81 mg by mouth daily.      . cloNIDine (CATAPRES) 0.1 MG tablet Take 1 tablet by mouth Daily.      [name] doxazosin (CARDURA) 8 MG tablet Take 1 tablet by mouth at bedtime.      [name] KLOR-CON M20 20 MEQ tablet Take 1 tablet by mouth Daily.      [name] MICARDIS HCT 80-12.5 MG per tablet Take 1 tablet by mouth Daily.      . Multiple Vitamin (MULTIVITAMIN) tablet Take 1 tablet by mouth daily.      [name] torsemide (DEMADEX) 20 MG tablet Take 1 tablet by mouth Daily.      . traMADol  (ULTRAM) 50 MG tablet        No current facility-administered medications for this visit.    Past Medical History  Diagnosis Date  . Arthritis   . Hypertension   . HHD (hypertensive heart disease)   . Mild diastolic dysfunction   . Borderline diabetes mellitus   . Mild obesity   . RBBB     Past Surgical History  Procedure Laterality Date  . Cataract extraction w/ intraocular lens  implant, bilateral  2013  . Abdominal hysterectomy  1986  . Colon surgery  2006  . US echocardiography  04/26/2007    mild MR & TR,LA mildly dilated,EF =>55%  . Stress myoview  04/26/2007    No ischemia    Family History  Problem Relation Age of Onset  . Colon cancer Sister   . Heart attack Mother   . Stroke Father   . Cancer Maternal Grandmother   . Heart failure Maternal Grandfather   . Heart failure Paternal Grandmother   . Kidney disease Paternal Grandfather   . Heart attack Brother   . Hypertension Brother   . Cancer Brother   . Hypertension Sister   . Cancer Sister   . Cancer Sister   . Diabetes Sister     History   Social History  . Marital Status: Married    Spouse Name: N/A    Number of Children: N/A  . Years of Education: N/A   Occupational History  .  Not on file.   Social History Main Topics  . Smoking status: Never Smoker   . Smokeless tobacco: Never Used  . Alcohol Use: 0.6 oz/week    1 Glasses of wine per week  . Drug Use: No  . Sexual Activity: Not on file   Other Topics Concern  . Not on file   Social History Narrative  . No narrative on file    Review of systems: The patient specifically denies any chest pain at rest or with exertion, dyspnea at rest or with exertion, orthopnea, paroxysmal nocturnal dyspnea, syncope, palpitations, focal neurological deficits, intermittent claudication, lower extremity edema, unexplained weight gain, cough, hemoptysis or wheezing.  The patient also denies abdominal pain, nausea, vomiting, dysphagia, diarrhea,  constipation, polyuria, polydipsia, dysuria, hematuria, frequency, urgency, abnormal bleeding or bruising, fever, chills, unexpected weight changes, mood swings, change in skin or hair texture, change in voice quality, auditory or visual problems, allergic reactions or rashes, new musculoskeletal complaints other than usual "aches and pains".    PHYSICAL EXAM BP 150/84  Pulse 58  Resp 16  Ht 5\' 5"  (1.651 m)  Wt 197 lb 9.6 oz (89.631 kg)  BMI 32.88 kg/m2  General: Alert, oriented x3, no distress Head: no evidence of trauma, PERRL, EOMI, no exophtalmos or lid lag, no myxedema, no xanthelasma; normal ears, nose and oropharynx Neck: normal jugular venous pulsations and no hepatojugular reflux; brisk carotid pulses without delay and no carotid bruits Chest: clear to auscultation, no signs of consolidation by percussion or palpation, normal fremitus, symmetrical and full respiratory excursions Cardiovascular: normal position and quality of the apical impulse, regular rhythm, normal first and widely split second heart sounds, no murmurs, rubs or gallops Abdomen: no tenderness or distention, no masses by palpation, no abnormal pulsatility or arterial bruits, normal bowel sounds, no hepatosplenomegaly Extremities: no clubbing, cyanosis, there is trivial symmetrical ankle edema; 2+ radial, ulnar and brachial pulses bilaterally; 2+ right femoral, posterior tibial and dorsalis pedis pulses; 2+ left femoral, posterior tibial and dorsalis pedis pulses; no subclavian or femoral bruits Neurological: grossly nonfocal   EKG: sinus rhythm, right bundle branch block  Lipid Panel  2012 total cholesterol 165, triglycerides 61, HDL 73, LDL 69  BMET    Component Value Date/Time   NA 144 07/18/2006 1046   K 4.2 07/18/2006 1046   CL 106 07/18/2006 1046   CO2 31 07/18/2006 1046   GLUCOSE 76 07/18/2006 1046   BUN 21 07/18/2006 1046   CREATININE 1.0 07/18/2006 1046   CALCIUM 9.9 07/18/2006 1046   GFRNONAA 58  07/18/2006 1046   GFRAA 71 07/18/2006 1046     ASSESSMENT AND PLAN HYPERTENSION Control is excellent. She is on an unusual combination of alpha blocker and clonidine in addition to diuretics and ARB. This has worked well for her. Avoid calcium channel blockers since these may worsen her edema. Avoid beta blockers do to baseline bradycardia and right bundle branch block. I think her lower showed edema is due to a combination of venous insufficiency and possibly mild diastolic dysfunction. She is encouraged to lose weight and avoid excessive consumption of sweets and starches since she has borderline diabetes mellitus. She has excellent tolerance to physical activity by her report.   Patient Instructions  Your physician recommends that you schedule a follow-up appointment in: 1 Year     No orders of the defined types were placed in this encounter.   Meds ordered this encounter  Medications  . traMADol (ULTRAM) 50 MG tablet  Sig:     Holli Humbles, MD, [place] [phone] office [phone] pager

## 2013-09-11 NOTE — Patient Instructions (Signed)
Your physician recommends that you schedule a follow-up appointment in: 1 Year  

## 2013-09-30 ENCOUNTER — Ambulatory Visit
Admission: RE | Admit: 2013-09-30 | Discharge: 2013-09-30 | Disposition: A | Discharge status: Home / Self Care | Payer: Medicare Other | Source: Ambulatory Visit | Attending: Family Medicine | Admitting: Family Medicine

## 2013-09-30 ENCOUNTER — Other Ambulatory Visit: Payer: Self-pay | Admitting: Family Medicine

## 2013-09-30 DIAGNOSIS — S8002XA Contusion of left knee, initial encounter: Secondary | ICD-10-CM

## 2013-09-30 DIAGNOSIS — S0083XA Contusion of other part of head, initial encounter: Secondary | ICD-10-CM

## 2014-01-16 ENCOUNTER — Other Ambulatory Visit: Payer: Self-pay | Admitting: Family Medicine

## 2014-01-16 DIAGNOSIS — M5412 Radiculopathy, cervical region: Secondary | ICD-10-CM

## 2014-01-22 ENCOUNTER — Ambulatory Visit
Admission: RE | Admit: 2014-01-22 | Discharge: 2014-01-22 | Disposition: A | Discharge status: Home / Self Care | Payer: Medicare Other | Source: Ambulatory Visit | Attending: Family Medicine | Admitting: Family Medicine

## 2014-01-22 DIAGNOSIS — M5412 Radiculopathy, cervical region: Secondary | ICD-10-CM

## 2014-09-02 ENCOUNTER — Encounter: Payer: Self-pay | Admitting: Internal Medicine

## 2014-10-08 ENCOUNTER — Ambulatory Visit: Payer: BC Managed Care – PPO | Admitting: Cardiovascular Disease

## 2014-10-13 ENCOUNTER — Ambulatory Visit (INDEPENDENT_AMBULATORY_CARE_PROVIDER_SITE_OTHER): Payer: Medicare PPO | Admitting: Cardiovascular Disease

## 2014-10-13 ENCOUNTER — Encounter: Payer: Self-pay | Admitting: Cardiovascular Disease

## 2014-10-13 VITALS — BP 146/86 | HR 72 | Resp 16 | Ht 65.0 in | Wt 199.1 lb

## 2014-10-13 DIAGNOSIS — I119 Hypertensive heart disease without heart failure: Secondary | ICD-10-CM | POA: Diagnosis not present

## 2014-10-13 DIAGNOSIS — I10 Essential (primary) hypertension: Secondary | ICD-10-CM | POA: Diagnosis not present

## 2014-10-13 NOTE — Progress Notes (Signed)
Patient ID: MYSTIE ORMAND, female   DOB: 02-01-1935, 80 y.o.   MRN: [id number]     Cardiology Office Note   Date:  10/13/2014   ID:  IRA BUSBIN, DOB 1934-05-25, MRN [id number]  PCP:  Shirline Frees, MD  Cardiologist:   Sanda Klein, MD   Chief Complaint  Patient presents with  . Annual Exam    patient reports swelling and tingling going up and down legs-painful.      History of Present Illness: NORETTA FRIER is a 80 y.o. female who presents for  Follow-up for severe systemic hypertension and hypertensive heart disease.   She feels great and just went on another trip to [place] with her daughter where she walked a lot without any problems with intermittent claudication. Her ankle edema is better than usual. She denies any dyspnea, palpitations, syncope, chest tightness or pain or focal neurological deficits. She complains of a tingly sensation and pain in both her legs that is present at rest is not consistent with intermittent claudication. I think she is describing sciatica. She has known back problems.  She has long-standing systemic hypertension, left ventricular hypertrophy, mild diastolic dysfunction, chronic right bundle-branch block,obesity, borderline diabetes mellitus and chronic complaints of lower extremity edema related in part to venous insufficiency. Her last echocardiogram was performed in 2009 it showed normal LV ejection fraction and impaired left ventricular relaxation. The left atrium is mildly dilated. She had a nuclear stress test performed in 2009 with normal results.   Past Medical History  Diagnosis Date  . Arthritis   . Hypertension   . HHD (hypertensive heart disease)   . Mild diastolic dysfunction   . Borderline diabetes mellitus   . Mild obesity   . RBBB     Past Surgical History  Procedure Laterality Date  . Cataract extraction w/ intraocular lens  implant, bilateral  2013  . Abdominal hysterectomy  1986  . Colon surgery  2006  . US  echocardiography  04/26/2007    mild MR & TR,LA mildly dilated,EF =>55%  . Stress myoview  04/26/2007    No ischemia     Current Outpatient Prescriptions  Medication Sig Dispense Refill  . amLODipine (NORVASC) 2.5 MG tablet Take 1 tablet by mouth daily.  0  . aspirin 81 MG tablet Take 81 mg by mouth daily.    . cloNIDine (CATAPRES) 0.1 MG tablet Take 1 tablet by mouth Daily.    [name] doxazosin (CARDURA) 8 MG tablet Take 1 tablet by mouth at bedtime.    . fluticasone (FLONASE) 50 MCG/ACT nasal spray Place 2 sprays into both nostrils daily.    [name] KLOR-CON M20 20 MEQ tablet Take 1 tablet by mouth Daily.    [name] MICARDIS HCT 80-12.5 MG per tablet Take 1 tablet by mouth Daily.    . Multiple Vitamin (MULTIVITAMIN) tablet Take 1 tablet by mouth daily.    [name] torsemide (DEMADEX) 20 MG tablet Take 1 tablet by mouth Daily.    . traMADol (ULTRAM) 50 MG tablet      No current facility-administered medications for this visit.    Allergies:   Review of patient's allergies indicates no known allergies.    Social History:  The patient  reports that she has never smoked. She has never used smokeless tobacco. She reports that she drinks about 0.6 oz of alcohol per week. She reports that she does not use illicit drugs.   Family History:  The patient's family history includes Cancer in  her brother, maternal grandmother, sister, and sister; Colon cancer in her sister; Diabetes in her sister; Heart attack in her brother and mother; Heart failure in her maternal grandfather and paternal grandmother; Hypertension in her brother and sister; Kidney disease in her paternal grandfather; Stroke in her father.    ROS:  Please see the history of present illness.    Otherwise, review of systems positive for none.   All other systems are reviewed and negative.    PHYSICAL EXAM: VS:  BP 146/86 mmHg  Pulse 72  Resp 16  Ht 5\' 5"  (1.651 m)  Wt 199 lb 1.6 oz (90.311 kg)  BMI 33.13 kg/m2 , BMI Body mass index is 33.13  kg/(m^2).  General: Alert, oriented x3, no distress Head: no evidence of trauma, PERRL, EOMI, no exophtalmos or lid lag, no myxedema, no xanthelasma; normal ears, nose and oropharynx Neck: normal jugular venous pulsations and no hepatojugular reflux; brisk carotid pulses without delay and no carotid bruits Chest: clear to auscultation, no signs of consolidation by percussion or palpation, normal fremitus, symmetrical and full respiratory excursions Cardiovascular: normal position and quality of the apical impulse, regular rhythm, normal first and widely split second heart sounds, no murmurs, rubs or gallops Abdomen: no tenderness or distention, no masses by palpation, no abnormal pulsatility or arterial bruits, normal bowel sounds, no hepatosplenomegaly Extremities: no clubbing, cyanosis or edema; 2+ radial, ulnar and brachial pulses bilaterally; 2+ right femoral, posterior tibial and dorsalis pedis pulses; 2+ left femoral, posterior tibial and dorsalis pedis pulses; no subclavian or femoral bruits Neurological: grossly nonfocal Psych: euthymic mood, full affect   EKG:  EKG is ordered today. The ekg ordered today demonstrates  Sinus rhythm, right bundle branch block , no change from previous tracing   Recent Labs: No results found for requested labs within last 365 days.    Lipid Panel No results found for: CHOL, TRIG, HDL, CHOLHDL, VLDL, LDLCALC, LDLDIRECT    Wt Readings from Last 3 Encounters:  10/13/14 199 lb 1.6 oz (90.311 kg)  09/11/13 197 lb 9.6 oz (89.631 kg)  07/10/11 195 lb (88.451 kg)     .   ASSESSMENT AND PLAN:   blood pressure was slightly high today, but she was in a hurry since she was late for her appointment. At home her blood pressures consistently in the 130s/70s. No changes are made to her medications , although she is on a rather nonconventional combination of alpha blocker and clonidine as well as amlodipine , ARB and thiazide diuretic. Periodically her renal  function and potassium should be checked , will retrieve her most recent results from her primary care physician.    Current medicines are reviewed at length with the patient today.  The patient does not have concerns regarding medicines.  The following changes have been made:  no change  Labs/ tests ordered today include:  No orders of the defined types were placed in this encounter.     Patient Instructions  Dr. Sallyanne Kuster recommends that you schedule a follow-up appointment in: ONE YEAR       SignedSanda Klein, MD  10/13/2014 5:53 PM    Sanda Klein, MD, [facility] [phone] office [phone] pager

## 2014-10-13 NOTE — Patient Instructions (Signed)
Dr. Croitoru recommends that you schedule a follow-up appointment in: ONE YEAR   

## 2015-05-11 ENCOUNTER — Other Ambulatory Visit: Payer: Self-pay | Admitting: Orthopedic Surgery

## 2015-05-11 DIAGNOSIS — M47812 Spondylosis without myelopathy or radiculopathy, cervical region: Secondary | ICD-10-CM

## 2015-05-20 ENCOUNTER — Other Ambulatory Visit: Payer: Medicare PPO

## 2015-05-21 ENCOUNTER — Ambulatory Visit
Admission: RE | Admit: 2015-05-21 | Discharge: 2015-05-21 | Disposition: A | Discharge status: Home / Self Care | Payer: Medicare PPO | Source: Ambulatory Visit | Attending: Orthopedic Surgery | Admitting: Orthopedic Surgery

## 2015-05-21 DIAGNOSIS — M47812 Spondylosis without myelopathy or radiculopathy, cervical region: Secondary | ICD-10-CM

## 2015-09-15 ENCOUNTER — Ambulatory Visit (INDEPENDENT_AMBULATORY_CARE_PROVIDER_SITE_OTHER): Payer: Medicare PPO | Admitting: Cardiovascular Disease

## 2015-09-15 ENCOUNTER — Encounter: Payer: Self-pay | Admitting: Cardiovascular Disease

## 2015-09-15 VITALS — BP 140/88 | HR 62 | Ht 65.0 in | Wt 197.0 lb

## 2015-09-15 DIAGNOSIS — I872 Venous insufficiency (chronic) (peripheral): Secondary | ICD-10-CM | POA: Diagnosis not present

## 2015-09-15 DIAGNOSIS — I4510 Unspecified right bundle-branch block: Secondary | ICD-10-CM

## 2015-09-15 DIAGNOSIS — I119 Hypertensive heart disease without heart failure: Secondary | ICD-10-CM

## 2015-09-15 DIAGNOSIS — I10 Essential (primary) hypertension: Secondary | ICD-10-CM

## 2015-09-15 NOTE — Patient Instructions (Signed)
Dr Croitoru recommends that you schedule a follow-up appointment in 12 months. You will receive a reminder letter in the mail two months in advance. If you don't receive a letter, please call our office to schedule the follow-up appointment.  If you need a refill on your cardiac medications before your next appointment, please call your pharmacy. 

## 2015-09-15 NOTE — Progress Notes (Signed)
Cardiology Office Note    Date:  09/15/2015   ID:  Kaitlin [name], DOB 06/12/1934, MRN [id number]  PCP:  Shirline Frees, MD  Cardiologist:   Sanda Klein, MD   No chief complaint on file.   History of Present Illness:  Kaitlin [name] is a 81 y.o. female with severe HTN and hypertensive heart disease (LVH, Diastolic dysfunction by echo criteria, without clinical heart failure), peripheral venous insufficiency of the lower extremities, chronic right bundle branch block, obesity, borderline diabetes mellitus, presenting for routine follow-up.  She generally feels well and denies complaints of exertional angina or dyspnea, palpitations or syncope, dizziness or focal neurological complaints. She does not have intermittent claudication but describes pain and paresthesias in her lower extremities after prolonged orthostasis (for example when she cooks a meal). The symptoms are relieved by elevating her legs. She is not wearing compression stockings. She has not developed any skin ulcers or cellulitis.    Past Medical History:  Diagnosis Date  . Arthritis   . Borderline diabetes mellitus   . HHD (hypertensive heart disease)   . Hypertension   . Mild diastolic dysfunction   . Mild obesity   . RBBB     Past Surgical History:  Procedure Laterality Date  . ABDOMINAL HYSTERECTOMY  1986  . CATARACT EXTRACTION W/ INTRAOCULAR LENS  IMPLANT, BILATERAL  2013  . COLON SURGERY  2006  . stress myoview  04/26/2007   No ischemia  . US ECHOCARDIOGRAPHY  04/26/2007   mild MR & TR,LA mildly dilated,EF =>55%    Current Medications: Outpatient Medications Prior to Visit  Medication Sig Dispense Refill  . amLODipine (NORVASC) 2.5 MG tablet Take 1 tablet by mouth daily.  0  . aspirin 81 MG tablet Take 81 mg by mouth daily.    . cloNIDine (CATAPRES) 0.1 MG tablet Take 1 tablet by mouth Daily.    [name] doxazosin (CARDURA) 8 MG tablet Take 1 tablet by mouth at bedtime.    . fluticasone (FLONASE) 50 MCG/ACT  nasal spray Place 2 sprays into both nostrils daily.    [name] KLOR-CON M20 20 MEQ tablet Take 1 tablet by mouth Daily.    [name] MICARDIS HCT 80-12.5 MG per tablet Take 1 tablet by mouth Daily.    . Multiple Vitamin (MULTIVITAMIN) tablet Take 1 tablet by mouth daily.    [name] torsemide (DEMADEX) 20 MG tablet Take 1 tablet by mouth Daily.    . traMADol (ULTRAM) 50 MG tablet      No facility-administered medications prior to visit.      Allergies:   Review of patient's allergies indicates no known allergies.   Social History   Social History  . Marital status: Married    Spouse name: N/A  . Number of children: N/A  . Years of education: N/A   Social History Main Topics  . Smoking status: Never Smoker  . Smokeless tobacco: Never Used  . Alcohol use 0.6 oz/week    1 Glasses of wine per week  . Drug use: No  . Sexual activity: Not on file   Other Topics Concern  . Not on file   Social History Narrative  . No narrative on file     Family History:  The patient's family history includes Cancer in her brother, maternal grandmother, sister, and sister; Colon cancer in her sister; Diabetes in her sister; Heart attack in her brother and mother; Heart failure in her maternal grandfather and paternal grandmother; Hypertension in her brother  and sister; Kidney disease in her paternal grandfather; Stroke in her father.   ROS:   Please see the history of present illness.    ROS All other systems reviewed and are negative.   PHYSICAL EXAM:   VS:  BP 140/88   Pulse 62   Ht 5\' 5"  (1.651 m)   Wt 197 lb (89.4 kg)   BMI 32.78 kg/m    GEN: Well nourished, well developed, in no acute distress  HEENT: normal  Neck: no JVD, carotid bruits, or masses Cardiac: RRR; no murmurs, rubs, or gallops, Symmetrical 1+ pedal and ankle edema , normal bilateral radial, ulnar and pedal pulses no subclavian or femoral bruits Respiratory:  clear to auscultation bilaterally, normal work of breathing GI: soft, nontender,  nondistended, + BS MS: no deformity or atrophy  Skin: warm and dry, no rash Neuro:  Alert and Oriented x 3, Strength and sensation are intact Psych: euthymic mood, full affect  Wt Readings from Last 3 Encounters:  10/13/14 199 lb 1.6 oz (90.3 kg)  09/11/13 197 lb 9.6 oz (89.6 kg)  07/10/11 195 lb (88.5 kg)      Studies/Labs Reviewed:   EKG:  EKG is ordered today.  The ekg ordered today demonstrates Normal sinus rhythm, chronic right bundle branch block and inferolateral T-wave inversion, QTC 430 ms. No real change from previous tracing  Recent Labs: None available  ASSESSMENT:    1. Essential hypertension   2. Hypertensive heart disease without heart failure   3. Peripheral venous insufficiency   4. RBBB      PLAN:  In order of problems listed above:  1. HTN: Adequate control on current regimen. Need to get labs from primary care provider 2. LVH/ diastolic dysfunction: No symptoms or clinical signs of hypervolemia 3. Venous Insufficiency: Discussed frequent elevation of lower extremities and use of compression stockings 4. RBBB: No evidence of right heart enlargement or dysfunction by remote echo    Medication Adjustments/Labs and Tests Ordered: Current medicines are reviewed at length with the patient today.  Concerns regarding medicines are outlined above.  Medication changes, Labs and Tests ordered today are listed in the Patient Instructions below. Patient Instructions  Dr Sallyanne Kuster recommends that you schedule a follow-up appointment in 12 months. You will receive a reminder letter in the mail two months in advance. If you don't receive a letter, please call our office to schedule the follow-up appointment.  If you need a refill on your cardiac medications before your next appointment, please call your pharmacy.    Signed, Sanda Klein, MD  09/15/2015 9:17 AM    [facility] [place], [place], [place]  [address] Phone: [phone];  Fax: [phone]

## 2016-05-22 ENCOUNTER — Encounter: Payer: Self-pay | Admitting: Internal Medicine

## 2016-06-08 ENCOUNTER — Telehealth: Payer: Self-pay | Admitting: Internal Medicine

## 2016-06-08 NOTE — Telephone Encounter (Signed)
Can pt be a direct colon? Please advise.

## 2016-06-08 NOTE — Telephone Encounter (Signed)
Know her well. Definitely ok for direct previsit the colon in [place]. Thanks

## 2016-08-14 ENCOUNTER — Ambulatory Visit (AMBULATORY_SURGERY_CENTER): Payer: Self-pay | Admitting: *Deleted

## 2016-08-14 VITALS — Ht 65.0 in | Wt 196.0 lb

## 2016-08-14 DIAGNOSIS — Z86010 Personal history of colonic polyps: Secondary | ICD-10-CM

## 2016-08-14 MED ORDER — NA SULFATE-K SULFATE-MG SULF 17.5-3.13-1.6 GM/177ML PO SOLN
ORAL | 0 refills | Status: DC
Start: 2016-08-14 — End: 2016-08-28

## 2016-08-14 NOTE — Progress Notes (Signed)
No allergies to eggs or soy. No problems with anesthesia.  Pt given Emmi instructions for colonoscopy  No oxygen use  No diet drug use  

## 2016-08-28 ENCOUNTER — Ambulatory Visit (AMBULATORY_SURGERY_CENTER): Payer: Medicare PPO | Admitting: Internal Medicine

## 2016-08-28 ENCOUNTER — Encounter: Payer: Self-pay | Admitting: Internal Medicine

## 2016-08-28 VITALS — BP 138/63 | HR 57 | Temp 97.1°F | Resp 16 | Ht 65.0 in | Wt 196.0 lb

## 2016-08-28 DIAGNOSIS — Z86010 Personal history of colonic polyps: Secondary | ICD-10-CM

## 2016-08-28 MED ORDER — FLEET ENEMA 7-19 GM/118ML RE ENEM
1.0000 | ENEMA | Freq: Once | RECTAL | Status: AC
  Administered 2016-08-28: 1 via RECTAL

## 2016-08-28 MED ORDER — SODIUM CHLORIDE 0.9 % IV SOLN: 500.0000 mL | INTRAVENOUS | Status: AC

## 2016-08-28 NOTE — Progress Notes (Signed)
No problems noted in the recovery room. maw 

## 2016-08-28 NOTE — Patient Instructions (Signed)
YOU HAD AN ENDOSCOPIC PROCEDURE TODAY AT [facility]:   Refer to the procedure report that was given to you for any specific questions about what was found during the examination.  If the procedure report does not answer your questions, please call your gastroenterologist to clarify.  If you requested that your care partner not be given the details of your procedure findings, then the procedure report has been included in a sealed envelope for you to review at your convenience later.  YOU SHOULD EXPECT: Some feelings of bloating in the abdomen. Passage of more gas than usual.  Walking can help get rid of the air that was put into your GI tract during the procedure and reduce the bloating. If you had a lower endoscopy (such as a colonoscopy or flexible sigmoidoscopy) you may notice spotting of blood in your stool or on the toilet paper. If you underwent a bowel prep for your procedure, you may not have a normal bowel movement for a few days.  Please Note:  You might notice some irritation and congestion in your nose or some drainage.  This is from the oxygen used during your procedure.  There is no need for concern and it should clear up in a day or so.  SYMPTOMS TO REPORT IMMEDIATELY:   Following lower endoscopy (colonoscopy or flexible sigmoidoscopy):  Excessive amounts of blood in the stool  Significant tenderness or worsening of abdominal pains  Swelling of the abdomen that is new, acute  Fever of 100F or higher   For urgent or emergent issues, a gastroenterologist can be reached at any hour by calling [phone].   DIET:  We do recommend a small meal at first, but then you may proceed to your regular diet.  Drink plenty of fluids but you should avoid alcoholic beverages for 24 hours.  ACTIVITY:  You should plan to take it easy for the rest of today and you should NOT DRIVE or use heavy machinery until tomorrow (because of the sedation medicines used during the test).     FOLLOW UP: Our staff will call the number listed on your records the next business day following your procedure to check on you and address any questions or concerns that you may have regarding the information given to you following your procedure. If we do not reach you, we will leave a message.  However, if you are feeling well and you are not experiencing any problems, there is no need to return our call.  We will assume that you have returned to your regular daily activities without incident.    If any biopsies were taken you will be contacted by phone or by letter within the next 1-3 weeks.  Please call us at [phone] if you have not heard about the biopsies in 3 weeks.    SIGNATURES/CONFIDENTIALITY: You and/or your care partner have signed paperwork which will be entered into your electronic medical record.  These signatures attest to the fact that that the information above on your After Visit Summary has been reviewed and is understood.  Full responsibility of the confidentiality of this discharge information lies with you and/or your care-partner.   You may resume your current medications today. No repeat colonoscopy. Please call if any questions or concerns.

## 2016-08-28 NOTE — Op Note (Signed)
[facility] Patient Name: Kaitlin [name] Procedure Date: 08/28/2016 9:46 AM MRN: [id number] Endoscopist: Docia Chuck. Henrene Pastor , MD Age: 82 Referring MD:  Date of Birth: 1934-05-26 Gender: Female Account #: [id number] Procedure:                Colonoscopy Indications:              High risk colon cancer surveillance: Personal                            history of adenoma (10 mm or greater in size), High                            risk colon cancer surveillance: Personal history of                            adenoma with villous component. Large tubulovillous                            adenoma 2004 status post right hemicolectomy.                            Subsequent examinations 2006, 2010, in 2013                            (negative for neoplasia). Medicines:                Monitored Anesthesia Care Procedure:                Pre-Anesthesia Assessment:                           - Prior to the procedure, a History and Physical                            was performed, and patient medications and                            allergies were reviewed. The patient's tolerance of                            previous anesthesia was also reviewed. The risks                            and benefits of the procedure and the sedation                            options and risks were discussed with the patient.                            All questions were answered, and informed consent                            was obtained. Prior Anticoagulants: The patient has  taken no previous anticoagulant or antiplatelet                            agents. ASA Grade Assessment: III - A patient with                            severe systemic disease. After reviewing the risks                            and benefits, the patient was deemed in                            satisfactory condition to undergo the procedure.                           After obtaining informed consent, the  colonoscope                            was passed under direct vision. Throughout the                            procedure, the patient's blood pressure, pulse, and                            oxygen saturations were monitored continuously. The                            Colonoscope was introduced through the anus and                            advanced to the the ileocolonic anastomosis.                            Surgical anastomosis and rectum were photographed.                            The quality of the bowel preparation was good. The                            colonoscopy was performed without difficulty. The                            patient tolerated the procedure well. The bowel                            preparation used was SUPREP, followed by cleansing                            enema. Scope In: 9:51:56 AM Scope Out: 10:04:18 AM Scope Withdrawal Time: 0 hours 8 minutes 28 seconds  Total Procedure Duration: 0 hours 12 minutes 22 seconds  Findings:                 The entire examined colon appeared normal on direct  and retroflexion views. Complications:            No immediate complications. Estimated blood loss:                            None. Estimated Blood Loss:     Estimated blood loss: none. Impression:               - The entire examined colon is normal on direct and                            retroflexion views.                           - No specimens collected. Recommendation:           - Repeat colonoscopy is not recommended for                            surveillance.                           - Patient has a contact number available for                            emergencies. The signs and symptoms of potential                            delayed complications were discussed with the                            patient. Return to normal activities tomorrow.                            Written discharge instructions were provided to the                             patient.                           - Resume previous diet.                           - Continue present medications. Docia Chuck. Henrene Pastor, MD 08/28/2016 10:09:37 AM This report has been signed electronically.

## 2016-08-28 NOTE — Progress Notes (Signed)
Report to PACU, RN, vss, BBS= Clear.  

## 2016-08-29 ENCOUNTER — Telehealth: Payer: Self-pay | Admitting: *Deleted

## 2016-08-29 NOTE — Telephone Encounter (Signed)
  Follow up Call-  Call back number 08/28/2016  Post procedure Call Back phone  # [phone]  Permission to leave phone message Yes  Some recent data might be hidden     Patient questions:  Do you have a fever, pain , or abdominal swelling? No. Pain Score  0 *  Have you tolerated food without any problems? Yes.    Have you been able to return to your normal activities? Yes.    Do you have any questions about your discharge instructions: Diet   No. Medications  No. Follow up visit  No.  Do you have questions or concerns about your Care? Yes.    Actions: * If pain score is 4 or above: No action needed, pain <4.

## 2016-10-30 ENCOUNTER — Ambulatory Visit (INDEPENDENT_AMBULATORY_CARE_PROVIDER_SITE_OTHER): Payer: Medicare PPO | Admitting: Cardiovascular Disease

## 2016-10-30 ENCOUNTER — Encounter: Payer: Self-pay | Admitting: Cardiovascular Disease

## 2016-10-30 VITALS — BP 150/90 | HR 54 | Ht 65.0 in | Wt 194.6 lb

## 2016-10-30 DIAGNOSIS — I10 Essential (primary) hypertension: Secondary | ICD-10-CM

## 2016-10-30 DIAGNOSIS — I4510 Unspecified right bundle-branch block: Secondary | ICD-10-CM | POA: Diagnosis not present

## 2016-10-30 DIAGNOSIS — I119 Hypertensive heart disease without heart failure: Secondary | ICD-10-CM | POA: Diagnosis not present

## 2016-10-30 DIAGNOSIS — I872 Venous insufficiency (chronic) (peripheral): Secondary | ICD-10-CM

## 2016-10-30 NOTE — Progress Notes (Signed)
Cardiology Office Note    Date:  10/30/2016   ID:  Kaitlin, [name] [birth year], MRN [id number]  PCP:  Shirline Frees, MD  Cardiologist:   Sanda Klein, MD   Chief Complaint  Patient presents with  . Follow-up    History of Present Illness:  Kaitlin [name] is a 82 y.o. female with severe HTN and hypertensive heart disease (LVH, Diastolic dysfunction by echo criteria, without clinical heart failure), peripheral venous insufficiency of the lower extremities, chronic right bundle branch block, obesity, borderline diabetes mellitus, presenting for routine follow-up.  The patient specifically denies any chest pain at rest exertion, dyspnea at rest or with exertion, orthopnea, paroxysmal nocturnal dyspnea, syncope, palpitations, focal neurological deficits, intermittent claudication, worsening of lower extremity edema, unexplained weight gain, cough, hemoptysis or wheezing. She still enjoys mowing her own lawn with a self-propelled push lawnmower. Denies any difficulty or dyspnea with this.  As before has some mild swelling of the ankles especially towards the end of the day, resolved after overnight decubitus. Legs sometimes feel little uncomfortable and swollen, resolves with walking or elevating legs.   Past Medical History:  Diagnosis Date  . Arthritis   . Borderline diabetes mellitus   . HHD (hypertensive heart disease)   . Hypertension   . Mild diastolic dysfunction   . Mild obesity   . RBBB     Past Surgical History:  Procedure Laterality Date  . ABDOMINAL HYSTERECTOMY  1986  . CATARACT EXTRACTION W/ INTRAOCULAR LENS  IMPLANT, BILATERAL  2013  . COLON SURGERY  2006  . stress myoview  04/26/2007   No ischemia  . US ECHOCARDIOGRAPHY  04/26/2007   mild MR & TR,LA mildly dilated,EF =>55%    Current Medications: Outpatient Medications Prior to Visit  Medication Sig Dispense Refill  . Acetaminophen (TYLENOL 8 HOUR PO) Take 1 tablet by mouth 3 times/day as needed-between meals  & bedtime.    [name] amLODipine (NORVASC) 2.5 MG tablet Take 1 tablet by mouth daily.  0  . aspirin 81 MG tablet Take 81 mg by mouth daily.    . cloNIDine (CATAPRES) 0.1 MG tablet Take 1 tablet by mouth Daily.    . fluticasone (FLONASE) 50 MCG/ACT nasal spray Place 2 sprays into both nostrils 3 times/day as needed-between meals & bedtime.     [name] KLOR-CON M20 20 MEQ tablet Take 1 tablet by mouth Daily.    [name] MICARDIS HCT 80-12.5 MG per tablet Take 1 tablet by mouth Daily.    . Multiple Vitamin (MULTIVITAMIN) tablet Take 1 tablet by mouth daily.    [name] terazosin (HYTRIN) 5 MG capsule Take 5 mg by mouth at bedtime.    . torsemide (DEMADEX) 20 MG tablet Take 1 tablet by mouth Daily.    . traMADol (ULTRAM) 50 MG tablet Take 50 mg by mouth daily.      Facility-Administered Medications Prior to Visit  Medication Dose Route Frequency Provider Last Rate Last Dose  . 0.9 %  sodium chloride infusion  500 mL Intravenous Continuous Irene Shipper, MD         Allergies:   Patient has no known allergies.   Social History   Social History  . Marital status: Married    Spouse name: N/A  . Number of children: N/A  . Years of education: N/A   Social History Main Topics  . Smoking status: Never Smoker  . Smokeless tobacco: Never Used  . Alcohol use 0.6 oz/week    1  Glasses of wine per week  . Drug use: No  . Sexual activity: Not on file   Other Topics Concern  . Not on file   Social History Narrative  . No narrative on file     Family History:  The patient's family history includes Cancer in her brother, maternal grandmother, sister, and sister; Colon cancer (age of onset: 68) in her sister; Diabetes in her sister; Heart attack in her brother and mother; Heart failure in her maternal grandfather and paternal grandmother; Hypertension in her brother and sister; Kidney disease in her paternal grandfather; Stroke in her father.   ROS:   Please see the history of present illness.    ROS All other systems  reviewed and are negative.   PHYSICAL EXAM:   VS:  BP (!) 150/90   Pulse (!) 54   Ht 5\' 5"  (1.651 m)   Wt 194 lb 9.6 oz (88.3 kg)   BMI 32.38 kg/m    Recheck blood pressure 135/72 mmHg General: Alert, oriented x3, no distress, mildly obese, appears younger than stated age Head: no evidence of trauma, PERRL, EOMI, no exophtalmos or lid lag, no myxedema, no xanthelasma; normal ears, nose and oropharynx Neck: normal jugular venous pulsations and no hepatojugular reflux; brisk carotid pulses without delay and no carotid bruits Chest: clear to auscultation, no signs of consolidation by percussion or palpation, normal fremitus, symmetrical and full respiratory excursions Cardiovascular: normal position and quality of the apical impulse, regular rhythm, normal first and widely split second heart sounds, no murmurs, rubs or gallops Abdomen: no tenderness or distention, no masses by palpation, no abnormal pulsatility or arterial bruits, normal bowel sounds, no hepatosplenomegaly Extremities: no clubbing, cyanosis or edema; 2+ radial, ulnar and brachial pulses bilaterally; 2+ right femoral, posterior tibial and dorsalis pedis pulses; 2+ left femoral, posterior tibial and dorsalis pedis pulses; no subclavian or femoral bruits Neurological: grossly nonfocal Psych: Normal mood and affect   Wt Readings from Last 3 Encounters:  10/30/16 194 lb 9.6 oz (88.3 kg)  08/28/16 196 lb (88.9 kg)  08/14/16 196 lb (88.9 kg)      Studies/Labs Reviewed:   EKG:  EKG is ordered today.  The ekg ordered today demonstrates Sinus bradycardia, right bundle branch block, diffuse T-wave abnormality, especially prominent in the inferior lateral leads, not changed from previous tracing. QRS 138 ms, QTC 458 ms  Recent Labs: 07/13/2016 Hemoglobin A1c 6.1%, normal LFTs, creatinine 1.07, potassium 4.1, TSH 1.06 Cholesterol 189, HDL 82, LDL 95, triglycerides 58  ASSESSMENT:    1. Essential hypertension   2.  Hypertensive heart disease without heart failure   3. Peripheral venous insufficiency   4. RBBB      PLAN:  In order of problems listed above:  1. HTN: Adequate control. Some variation in blood pressure throughout the days related to the short acting effect of the clonidine. She would like to continue it since it helps with sleep. No change in medications today. 2. LVH/ diastolic dysfunction: Clinically euvolemic, asymptomatic 3. Venous Insufficiency: Reemphasized the need to keep legs elevated and suggested use of compression stockings. 4. RBBB: Previous echo did not show evidence of right heart dysfunction or dilation.    Medication Adjustments/Labs and Tests Ordered: Current medicines are reviewed at length with the patient today.  Concerns regarding medicines are outlined above.  Medication changes, Labs and Tests ordered today are listed in the Patient Instructions below. Patient Instructions  Dr Sallyanne Kuster recommends that you schedule a follow-up appointment in  12 months. You will receive a reminder letter in the mail two months in advance. If you don't receive a letter, please call our office to schedule the follow-up appointment.  If you need a refill on your cardiac medications before your next appointment, please call your pharmacy.    Signed, Sanda Klein, MD  10/30/2016 9:18 AM    [facility] [place], [place], [place]  [address] Phone: [phone]; Fax: [phone]

## 2016-10-30 NOTE — Patient Instructions (Signed)
Dr Croitoru recommends that you schedule a follow-up appointment in 12 months. You will receive a reminder letter in the mail two months in advance. If you don't receive a letter, please call our office to schedule the follow-up appointment.  If you need a refill on your cardiac medications before your next appointment, please call your pharmacy. 
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
## 2019-01-09 ENCOUNTER — Ambulatory Visit (INDEPENDENT_AMBULATORY_CARE_PROVIDER_SITE_OTHER): Payer: Medicare Other | Admitting: Physician Assistant

## 2019-01-09 ENCOUNTER — Encounter: Payer: Self-pay | Admitting: Physician Assistant

## 2019-01-09 ENCOUNTER — Other Ambulatory Visit: Payer: Self-pay

## 2019-01-09 VITALS — BP 180/98 | HR 71 | Temp 97.2°F | Ht 64.0 in | Wt 187.0 lb

## 2019-01-09 DIAGNOSIS — M7989 Other specified soft tissue disorders: Secondary | ICD-10-CM

## 2019-01-09 DIAGNOSIS — Z79899 Other long term (current) drug therapy: Secondary | ICD-10-CM | POA: Diagnosis not present

## 2019-01-09 DIAGNOSIS — M79604 Pain in right leg: Secondary | ICD-10-CM

## 2019-01-09 DIAGNOSIS — R600 Localized edema: Secondary | ICD-10-CM | POA: Diagnosis not present

## 2019-01-09 DIAGNOSIS — I5032 Chronic diastolic (congestive) heart failure: Secondary | ICD-10-CM

## 2019-01-09 DIAGNOSIS — I10 Essential (primary) hypertension: Secondary | ICD-10-CM | POA: Diagnosis not present

## 2019-01-09 DIAGNOSIS — I493 Ventricular premature depolarization: Secondary | ICD-10-CM

## 2019-01-09 NOTE — Progress Notes (Signed)
Thanks, I saw DVT scan is for tomorrow.

## 2019-01-09 NOTE — Progress Notes (Signed)
Cardiology Office Note:    Date:  01/09/2019   ID:  Kaitlin [name], DOB Jul 16, 1934, MRN [id number]  PCP:  Shirline Frees, MD  Cardiologist:  Sanda Klein, MD   Referring MD: Shirline Frees, MD   Chief Complaint  Patient presents with  . Follow-up    unilateral lower extremity swelling and pain    History of Present Illness:    Kaitlin [name] is a 84 y.o. female with a hx of severe hypertension, hypertensive heart disease, LVH, chronic diastolic heart failure, peripheral venous insufficiency, chronic right bundle branch block, obesity, and diabetes mellitus.  By Dr. Sallyanne Kuster on 10/30/2016.  She is doing well at that time.  She is noted to have chronic leg swelling worse at the end of the day and resolves overnight.  She is maintained on 81 mg aspirin, amlodipine 2.5 mg, clonidine 0.1 mg once daily, 20 mg Demadex, and 20 mEq K.  She presents today for follow-up.  She reports unilateral swelling - right extremity. This started with swelling and discoloration in July. Swelling worsened in early Oct and she has now developed pain in that limb. Swelling used to be improved in the morning, but is now persistent throughout the day and night. She has been compliant on her demadex.   Her BP runs in the [id number] systolic at home. She is only taking clonidine once daily at night. She is quite hypertensive today and she has taken all of her medications. Per Dr. Victorino December last note, her BP can fluctuate throughout the day.   Past Medical History:  Diagnosis Date  . Arthritis   . Borderline diabetes mellitus   . HHD (hypertensive heart disease)   . Hypertension   . Mild diastolic dysfunction   . Mild obesity   . RBBB     Past Surgical History:  Procedure Laterality Date  . ABDOMINAL HYSTERECTOMY  1986  . CATARACT EXTRACTION W/ INTRAOCULAR LENS  IMPLANT, BILATERAL  2013  . COLON SURGERY  2006  . stress myoview  04/26/2007   No ischemia  . US ECHOCARDIOGRAPHY  04/26/2007   mild MR & TR,LA  mildly dilated,EF =>55%    Current Medications: Current Meds  Medication Sig  . Acetaminophen (TYLENOL 8 HOUR PO) Take 1 tablet by mouth 3 times/day as needed-between meals & bedtime.  [name] amLODipine (NORVASC) 2.5 MG tablet Take 1 tablet by mouth daily.  [name] aspirin 81 MG tablet Take 81 mg by mouth daily.  . cloNIDine (CATAPRES) 0.1 MG tablet Take 1 tablet by mouth Daily.  . fluticasone (FLONASE) 50 MCG/ACT nasal spray Place 2 sprays into both nostrils 3 times/day as needed-between meals & bedtime.   [name] KLOR-CON M20 20 MEQ tablet Take 1 tablet by mouth Daily.  [name] MICARDIS HCT 80-12.5 MG per tablet Take 1 tablet by mouth Daily.  . Multiple Vitamin (MULTIVITAMIN) tablet Take 1 tablet by mouth daily.  [name] terazosin (HYTRIN) 5 MG capsule Take 5 mg by mouth at bedtime.  . torsemide (DEMADEX) 20 MG tablet Take 1 tablet by mouth Daily.  . traMADol (ULTRAM) 50 MG tablet Take 50 mg by mouth daily.      Allergies:   Patient has no known allergies.   Social History   Socioeconomic History  . Marital status: Married    Spouse name: Not on file  . Number of children: Not on file  . Years of education: Not on file  . Highest education level: Not on file  Occupational History  . Not  on file  Social Needs  . Financial resource strain: Not on file  . Food insecurity    Worry: Not on file    Inability: Not on file  . Transportation needs    Medical: Not on file    Non-medical: Not on file  Tobacco Use  . Smoking status: Never Smoker  . Smokeless tobacco: Never Used  Substance and Sexual Activity  . Alcohol use: Yes    Alcohol/week: 1.0 standard drinks    Types: 1 Glasses of wine per week  . Drug use: No  . Sexual activity: Not on file  Lifestyle  . Physical activity    Days per week: Not on file    Minutes per session: Not on file  . Stress: Not on file  Relationships  . Social Herbalist on phone: Not on file    Gets together: Not on file    Attends religious service: Not on  file    Active member of club or organization: Not on file    Attends meetings of clubs or organizations: Not on file    Relationship status: Not on file  Other Topics Concern  . Not on file  Social History Narrative  . Not on file     Family History: The patient's family history includes Cancer in her brother, maternal grandmother, sister, and sister; Colon cancer (age of onset: 36) in her sister; Diabetes in her sister; Heart attack in her brother and mother; Heart failure in her maternal grandfather and paternal grandmother; Hypertension in her brother and sister; Kidney disease in her paternal grandfather; Stroke in her father.  ROS:   Please see the history of present illness.     All other systems reviewed and are negative.  EKGs/Labs/Other Studies Reviewed:    The following studies were reviewed today:  none  EKG:  EKG is ordered today.  The ekg ordered today demonstrates sinus rhythm with PVCs, HR 71, RBBB, TWI precordial leads - stable from prior tracings  Recent Labs: No results found for requested labs within last 8760 hours.  Recent Lipid Panel No results found for: CHOL, TRIG, HDL, CHOLHDL, VLDL, LDLCALC, LDLDIRECT  Physical Exam:    VS:  BP (!) 180/98   Pulse 71   Temp (!) 97.2 F (36.2 C)   Ht 5\' 4"  (1.626 m)   Wt 187 lb (84.8 kg)   SpO2 98%   BMI 32.10 kg/m     Wt Readings from Last 3 Encounters:  01/09/19 187 lb (84.8 kg)  10/30/16 194 lb 9.6 oz (88.3 kg)  08/28/16 196 lb (88.9 kg)     GEN: Well nourished, well developed in no acute distress HEENT: Normal NECK: No JVD; No carotid bruits LYMPHATICS: No lymphadenopathy CARDIAC: irregular rhythm, regular rate, no murmurs, rubs, gallops RESPIRATORY:  Clear to auscultation without rales, wheezing or rhonchi  ABDOMEN: Soft, non-tender, non-distended MUSCULOSKELETAL:  B LE edema R > L, redness on right since Oct  SKIN: Warm and dry NEUROLOGIC:  Alert and oriented x 3 PSYCHIATRIC:  Normal affect    ASSESSMENT:    1. Lower extremity edema   2. Pain of right lower extremity   3. Essential hypertension   4. Medication management   5. Chronic diastolic heart failure (HCC)   6. Pain and swelling of lower extremity, right   7. PVC (premature ventricular contraction)    PLAN:    In order of problems listed above:  Hypertension - she has been  on clonidine once daily for some time and does not wish to increase to twice daily to avoid rebound hypertension - I will increase her amlodipine to 2.5 mg BID - she will return with a BP log   Chronic diastolic heart failure Chronic lower extremity swelling Unilateral lower extremity swelling with pain - right - pedal pulses are weak - I will order a lower extremity venous doppler to rule out DVT - I will also order ABIs and arterial dopplers to rule out occlusion - she generally wears compression socks - will check a BMP and BNP   PVCs on EKG - will check a Mg - irregular rhythm on exam   She will return in 2 weeks with BP log.      Medication Adjustments/Labs and Tests Ordered: Current medicines are reviewed at length with the patient today.  Concerns regarding medicines are outlined above.  Orders Placed This Encounter  Procedures  . BMET  . BNP  . Magnesium  . EKG 12-Lead  . LE ARTERIAL  . VAS [place] ABI WITH/WO TBI  . VAS [place] LOWER EXTREMITY VENOUS (DVT)   No orders of the defined types were placed in this encounter.   Signed, Ledora Bottcher, PA  01/09/2019 4:18 PM    [facility]

## 2019-01-09 NOTE — Patient Instructions (Addendum)
Labwork: BMET AND BNP TODAY HERE IN OUR OFFICE AT [facility]    If you have labs (blood work) drawn today and your tests are completely normal, you will receive your results only by: [name] MyChart Message (if you have MyChart) OR . A paper copy in the mail If you have any lab test that is abnormal or we need to change your treatment, we will call you to review the results.  Testing/Procedures: Your physician has requested that you have an ankle brachial index (ABI) and right leg ultrasound.. During this test an ultrasound and blood pressure cuff are used to evaluate the arteries that supply the arms and legs with blood. Allow thirty minutes for this exam. There are no restrictions or special instructions.    Follow-Up: 01-23-2019 @ 830AM WITH ANGELA DUKE  In Person .    At [facility], you and your health needs are our priority.  As part of our continuing mission to provide you with exceptional heart care, we have created designated Provider Care Teams.  These Care Teams include your primary Cardiologist (physician) and Advanced Practice Providers (APPs -  Physician Assistants and Nurse Practitioners) who all work together to provide you with the care you need, when you need it.  Thank you for choosing [facility] at [facility]!!             Happy Holidays!!

## 2019-01-10 ENCOUNTER — Telehealth: Payer: Medicare PPO | Admitting: Cardiovascular Disease

## 2019-01-10 ENCOUNTER — Ambulatory Visit (HOSPITAL_COMMUNITY)
Admission: RE | Admit: 2019-01-10 | Discharge: 2019-01-10 | Disposition: A | Discharge status: Home / Self Care | Payer: Medicare Other | Source: Ambulatory Visit | Attending: Cardiovascular Disease | Admitting: Cardiovascular Disease

## 2019-01-10 DIAGNOSIS — R600 Localized edema: Secondary | ICD-10-CM | POA: Insufficient documentation

## 2019-01-10 DIAGNOSIS — M79604 Pain in right leg: Secondary | ICD-10-CM | POA: Diagnosis not present

## 2019-01-10 LAB — BRAIN NATRIURETIC PEPTIDE: BNP: 207.2 pg/mL — ABNORMAL HIGH (ref 0.0–100.0)

## 2019-01-10 LAB — BASIC METABOLIC PANEL
BUN/Creatinine Ratio: 15 (ref 12–28)
BUN: 16 mg/dL (ref 8–27)
CO2: 27 mmol/L (ref 20–29)
Calcium: 9.8 mg/dL (ref 8.7–10.3)
Chloride: 105 mmol/L (ref 96–106)
Creatinine, Ser: 1.04 mg/dL — ABNORMAL HIGH (ref 0.57–1.00)
GFR calc Af Amer: 58 mL/min/{1.73_m2} — ABNORMAL LOW (ref >59)
GFR calc non Af Amer: 50 mL/min/{1.73_m2} — ABNORMAL LOW (ref >59)
Glucose: 89 mg/dL (ref 65–99)
Potassium: 4.3 mmol/L (ref 3.5–5.2)
Sodium: 146 mmol/L — ABNORMAL HIGH (ref 134–144)

## 2019-01-10 LAB — MAGNESIUM: Magnesium: 2.1 mg/dL (ref 1.6–2.3)

## 2019-01-13 ENCOUNTER — Other Ambulatory Visit: Payer: Self-pay | Admitting: Physician Assistant

## 2019-01-13 ENCOUNTER — Other Ambulatory Visit: Payer: Self-pay

## 2019-01-13 ENCOUNTER — Encounter (HOSPITAL_COMMUNITY): Payer: Medicare Other

## 2019-01-13 ENCOUNTER — Ambulatory Visit (HOSPITAL_COMMUNITY)
Admission: RE | Admit: 2019-01-13 | Discharge: 2019-01-13 | Disposition: A | Discharge status: Home / Self Care | Payer: Medicare Other | Source: Ambulatory Visit | Attending: Cardiology | Admitting: Cardiology

## 2019-01-13 DIAGNOSIS — L819 Disorder of pigmentation, unspecified: Secondary | ICD-10-CM

## 2019-01-13 DIAGNOSIS — R600 Localized edema: Secondary | ICD-10-CM

## 2019-01-13 DIAGNOSIS — R200 Anesthesia of skin: Secondary | ICD-10-CM | POA: Diagnosis not present

## 2019-01-13 DIAGNOSIS — R202 Paresthesia of skin: Secondary | ICD-10-CM

## 2019-01-13 DIAGNOSIS — M79604 Pain in right leg: Secondary | ICD-10-CM

## 2019-01-14 ENCOUNTER — Other Ambulatory Visit: Payer: Self-pay | Admitting: Physician Assistant

## 2019-01-14 ENCOUNTER — Telehealth: Payer: Self-pay | Admitting: Physician Assistant

## 2019-01-14 DIAGNOSIS — M79604 Pain in right leg: Secondary | ICD-10-CM

## 2019-01-14 DIAGNOSIS — R600 Localized edema: Secondary | ICD-10-CM

## 2019-01-14 MED ORDER — DOXYCYCLINE MONOHYDRATE 100 MG PO TABS: 100.0000 mg | ORAL_TABLET | Freq: Two times a day (BID) | ORAL | 0 refills | Status: AC

## 2019-01-14 NOTE — Telephone Encounter (Signed)
I called Kaitlin [name] with the results of her arterial and venous dopplers. I also explained the doxycycline prescription. Since increasing amlodipine to 2.5 mg BID, her pressure is well-controlled.

## 2019-01-22 ENCOUNTER — Telehealth: Payer: Self-pay

## 2019-01-22 NOTE — Telephone Encounter (Signed)
Spoke to pt. Made her aware that she will be seeing Coletta Memos, NP tomorrow, 12/17 instead of Fabian Sharp, [place]. Informed that Angie will not be in the office, but that her appointment will still be a regular office visit. Informed that time changed to 8:45 AM. Pt verbalized understanding and agreed with change.

## 2019-01-22 NOTE — Progress Notes (Signed)
Cardiology Clinic Note   Patient Name: Kaitlin [name] Date of Encounter: 01/23/2019  Primary Care Provider:  Shirline Frees, MD Primary Cardiologist:  Sanda Klein, MD  Patient Profile    Kaitlin [name] 84 year old female presents today for follow-up of her essential hypertension, coronary artery disease, and peripheral venous insufficiency.  Past Medical History    Past Medical History:  Diagnosis Date  . Arthritis   . Borderline diabetes mellitus   . HHD (hypertensive heart disease)   . Hypertension   . Mild diastolic dysfunction   . Mild obesity   . RBBB    Past Surgical History:  Procedure Laterality Date  . ABDOMINAL HYSTERECTOMY  1986  . CATARACT EXTRACTION W/ INTRAOCULAR LENS  IMPLANT, BILATERAL  2013  . COLON SURGERY  2006  . stress myoview  04/26/2007   No ischemia  . US ECHOCARDIOGRAPHY  04/26/2007   mild MR & TR,LA mildly dilated,EF =>55%    Allergies  No Known Allergies  History of Present Illness    Kaitlin [name] has a past medical history of severe hypertension, hypertensive heart disease, left ventricular hypertrophy, chronic diastolic CHF, peripheral venous insufficiency, chronic right bundle branch block, obesity, chronic lower extremity edema and diabetes mellitus.  She was last seen by Fabian Sharp PA-C on 01/09/2019.  During that time she was noted to have unilateral lower extremity edema (right lower extremity).  She indicates that this swelling and discoloration has started in July.  The swelling worsened in early October and she presented with pain in the left.  She indicated that the swelling has been improving in the morning however, it has become persistent through the day and night.  She had been compliant with her Demadex.  She was also hypertensive at that time.  Lower extremity venous Dopplers and ABIs were ordered at that time.  Her ABIs indicated probable mild bilateral tibial artery disease involving the anterior tibial and peroneal arteries.  No  significant disease was noted.  Her lower extremity venous Dopplers were negative for DVT.  She was given a 10-day course of doxycycline and asked to follow-up with her PCP.  She presents to the clinic today and states she feels well.  She has 1 more day of doxycycline.  She feels that her lower extremity redness is only on her right lower extremity but now also on her left lower extremity.  This appears to be stasis dermatitis.  She states her mother had the same swollen ankles as she became older.  She states she purchased lower extremity support stockings in the summer but has not worn them.  She is continually active in her house and seldom leaves sedentary.  I have encouraged her to wear her support stockings to the day, take them off at night.  Elevate her extremities to chest height when she is not active through the day and follow low-sodium diet.  Her lower extremity ABI/Dopplers were reviewed.  She denies chest pain, shortness of breath, increased lower extremity edema, fatigue, palpitations, melena, hematuria, hemoptysis, diaphoresis, weakness, presyncope, syncope, orthopnea, and PND.  Home Medications    Prior to Admission medications   Medication Sig Start Date End Date Taking? Authorizing Provider  Acetaminophen (TYLENOL 8 HOUR PO) Take 1 tablet by mouth 3 times/day as needed-between meals & bedtime.    [provider]  amLODipine (NORVASC) 2.5 MG tablet Take 1 tablet by mouth daily. 09/12/14   [provider]  aspirin 81 MG tablet Take 81 mg by  mouth daily.    [provider]  cloNIDine (CATAPRES) 0.1 MG tablet Take 1 tablet by mouth Daily. 06/16/11   [provider]  doxycycline (ADOXA) 100 MG tablet Take 1 tablet (100 mg total) by mouth 2 (two) times daily for 10 days. 01/14/19 01/24/19  Duke, Tami Lin, PA  fluticasone (FLONASE) 50 MCG/ACT nasal spray Place 2 sprays into both nostrils 3 times/day as needed-between meals & bedtime.     [provider]  KLOR-CON M20 20 MEQ tablet Take 1 tablet by mouth Daily. 06/08/11   [provider]  MICARDIS HCT 80-12.5 MG per tablet Take 1 tablet by mouth Daily. 06/08/11   [provider]  Multiple Vitamin (MULTIVITAMIN) tablet Take 1 tablet by mouth daily.    [provider]  terazosin (HYTRIN) 5 MG capsule Take 5 mg by mouth at bedtime. 07/30/15   [provider]  torsemide (DEMADEX) 20 MG tablet Take 1 tablet by mouth Daily. 06/17/11   [provider]  traMADol (ULTRAM) 50 MG tablet Take 50 mg by mouth daily.  09/08/13   [provider]    Family History    Family History  Problem Relation Age of Onset  . Colon cancer Sister 12  . Heart attack Mother   . Stroke Father   . Cancer Maternal Grandmother   . Heart failure Maternal Grandfather   . Heart failure Paternal Grandmother   . Kidney disease Paternal Grandfather   . Heart attack Brother   . Hypertension Brother   . Cancer Brother   . Hypertension Sister   . Cancer Sister   . Cancer Sister   . Diabetes Sister    She indicated that her mother is deceased. She indicated that her father is deceased. She indicated that only one of her eight sisters is alive. She indicated that only one of her seven brothers is alive. She indicated that the status of her maternal grandmother is unknown. She indicated that the status of her maternal grandfather is unknown. She indicated that the status of her paternal grandmother is unknown. She indicated that the status of her paternal grandfather is unknown.  Social History    Social History   Socioeconomic History  . Marital status: Married    Spouse name: Not on file  . Number of children: Not on file  . Years of education: Not on file  . Highest education level: Not on file  Occupational History  . Not on file  Tobacco Use  . Smoking status: Never Smoker  . Smokeless tobacco: Never Used  Substance and Sexual Activity  . Alcohol use:  Yes    Alcohol/week: 1.0 standard drinks    Types: 1 Glasses of wine per week  . Drug use: No  . Sexual activity: Not on file  Other Topics Concern  . Not on file  Social History Narrative  . Not on file   Social Determinants of Health   Financial Resource Strain:   . Difficulty of Paying Living Expenses: Not on file  Food Insecurity:   . Worried About Charity fundraiser in the Last Year: Not on file  . Ran Out of Food in the Last Year: Not on file  Transportation Needs:   . Lack of Transportation (Medical): Not on file  . Lack of Transportation (Non-Medical): Not on file  Physical Activity:   . Days of Exercise per Week: Not on file  . Minutes of Exercise per Session: Not on file  Stress:   . Feeling of Stress : Not on file  Social Connections:   . Frequency of Communication with Friends and Family: Not on file  . Frequency of Social Gatherings with Friends and Family: Not on file  . Attends Religious Services: Not on file  . Active Member of Clubs or Organizations: Not on file  . Attends Archivist Meetings: Not on file  . Marital Status: Not on file  Intimate Partner Violence:   . Fear of Current or Ex-Partner: Not on file  . Emotionally Abused: Not on file  . Physically Abused: Not on file  . Sexually Abused: Not on file     Review of Systems    General:  No chills, fever, night sweats or weight changes.  Cardiovascular:  No chest pain, dyspnea on exertion, bilateral nonpitting ankle edema, orthopnea, palpitations, paroxysmal nocturnal dyspnea. Dermatological: No rash, lesions/masses Respiratory: No cough, dyspnea Urologic: No hematuria, dysuria Abdominal:   No nausea, vomiting, diarrhea, bright red blood per rectum, melena, or hematemesis Neurologic:  No visual changes, wkns, changes in mental status. All other systems reviewed and are otherwise negative except as noted above.  Physical Exam    VS:  BP 120/72   Pulse 90   Temp (!) 97.3 F (36.3  C)   Ht 5\' 4"  (1.626 m)   Wt 190 lb (86.2 kg)   SpO2 99%   BMI 32.61 kg/m  , BMI Body mass index is 32.61 kg/m. GEN: Well nourished, well developed, in no acute distress. HEENT: normal. Neck: Supple, no JVD, carotid bruits, or masses. Cardiac: RRR, no murmurs, rubs, or gallops. No clubbing, cyanosis, bilateral nonpitting ankle edema.  Radials/DP/PT 2+ and equal bilaterally.  Respiratory:  Respirations regular and unlabored, clear to auscultation bilaterally. GI: Soft, nontender, nondistended, BS + x 4. MS: no deformity or atrophy. Skin: warm and dry, no rash.  Diffuse bilateral slight ankle erythema Neuro:  Strength and sensation are intact. Psych: Normal affect.  Accessory Clinical Findings    ECG personally reviewed by me today-none today- No acute changes  EKG 01/09/2019 Sinus rhythm with frequent PVCs right bundle branch block 71 bpm  Lower extremity venous Doppler 01/14/2019 Summary: Right: No evidence of deep vein thrombosis in the lower extremity. No indirect evidence of obstruction proximal to the inguinal ligament. No cystic structure found in the popliteal fossa. Superficial edema seen in distal portion of anterior,medial and posterior calf.  Left: No evidence of common femoral vein obstruction.   *See table(s) above for measurements and observations.  ABIs-01/13/2019 Summary: Right: Resting right ankle-brachial index is within normal range. No evidence of significant right lower extremity arterial disease. The right toe-brachial index is abnormal.  Left: Resting left ankle-brachial index is within normal range. No evidence of significant left lower extremity arterial disease. The left toe-brachial index is abnormal.  Probable mild bilateral tibial artery disease involving the anterior tibial and peroneal arteries, based on mild bilateral abnormal waveforms.  Assessment & Plan   1.  Lower extremity edema-generalized nonpitting lower extremity edema.  1 more  day of doxycycline.  Still with slight leg redness, now bilateral ankle this appears to be stasis dermatitis in nature.  Continue torsemide 20 mg tablet daily Continue potassium 20 mEq tablet daily Heart healthy low-sodium diet Increase physical activity as tolerated Lower extremity support stockings-patient has but has not been using.  Encouraged to use Elevate lower extremities when active  Essential hypertension-BP today 120/72.  Much better controlled at home. Continue amlodipine  2.5 mg daily Continue clonidine 0.1 mg daily Continue telmisartan 80 mg daily Continue torsemide 20 mg tablet daily  Chronic diastolic CHF-echocardiogram from 2009 showed an EF greater than 55%.  No increased work of breathing, very active throughout the day with chores inside her house.  Weight today stable at 190 pounds Heart healthy low-sodium diet Daily weights  PVCs-noted on 01/19/2019 EKG.  Magnesium at that time 2.1 Continue to monitor  Disposition: Follow-up with Dr. Sallyanne Kuster in 4 months.  Jossie Ng. [facility] [address] Office [phone] Fax [phone]

## 2019-01-23 ENCOUNTER — Ambulatory Visit (INDEPENDENT_AMBULATORY_CARE_PROVIDER_SITE_OTHER): Payer: Medicare Other | Admitting: General Practice

## 2019-01-23 ENCOUNTER — Encounter: Payer: Self-pay | Admitting: General Practice

## 2019-01-23 ENCOUNTER — Ambulatory Visit: Payer: Medicare Other | Admitting: Physician Assistant

## 2019-01-23 ENCOUNTER — Other Ambulatory Visit: Payer: Self-pay

## 2019-01-23 VITALS — BP 120/72 | HR 90 | Temp 97.3°F | Ht 64.0 in | Wt 190.0 lb

## 2019-01-23 DIAGNOSIS — R600 Localized edema: Secondary | ICD-10-CM | POA: Diagnosis not present

## 2019-01-23 DIAGNOSIS — I10 Essential (primary) hypertension: Secondary | ICD-10-CM | POA: Diagnosis not present

## 2019-01-23 DIAGNOSIS — I493 Ventricular premature depolarization: Secondary | ICD-10-CM | POA: Diagnosis not present

## 2019-01-23 DIAGNOSIS — I5032 Chronic diastolic (congestive) heart failure: Secondary | ICD-10-CM

## 2019-01-23 NOTE — Patient Instructions (Signed)
Medication Instructions:  The current medical regimen is effective;  continue present plan and medications as directed. Please refer to the Current Medication list given to you today. If you need a refill on your cardiac medications before your next appointment, please call your pharmacy.  Follow-Up: IN 3 months In Person You may see Sanda Klein, MD Coletta Memos, FNP or one of the following Advanced Practice Providers on your designated Care Team:  Almyra Deforest, PA-C Fabian Sharp, PA-C or [place], [place].    Special Instructions: PLEASE READ AND FOLLOW SALTY 6 (ATTACHED)  CONTINUE TO WEAR COMPRESSION STOCKINGS AND ELEVATE LOWER EXTREMITIES TO YOUR CHEST HEIGHT OR AS HIGH AS YOU CAN GET THEN WHILE SITTING.  Reduce your risk of getting COVID-19 With your heart disease it is especially important for people at increased risk of severe illness from COVID-19, and those who live with them, to protect themselves from getting COVID-19. The best way to protect yourself and to help reduce the spread of the virus that causes COVID-19 is to: [name] Limit your interactions with other people as much as possible. . Take precautions to prevent getting COVID-19 when you do interact with others. If you start feeling sick and think you may have COVID-19, get in touch with your healthcare provider within 24  At [facility], you and your health needs are our priority.  As part of our continuing mission to provide you with exceptional heart care, we have created designated Provider Care Teams.  These Care Teams include your primary Cardiologist (physician) and Advanced Practice Providers (APPs -  Physician Assistants and Nurse Practitioners) who all work together to provide you with the care you need, when you need it.  Thank you for choosing [facility] at [facility]!!        Happy Holidays!!

## 2019-01-23 NOTE — Progress Notes (Signed)
Thanks, Jesse 

## 2019-03-04 ENCOUNTER — Other Ambulatory Visit: Payer: Self-pay | Admitting: Family Medicine

## 2019-03-04 DIAGNOSIS — Z1231 Encounter for screening mammogram for malignant neoplasm of breast: Secondary | ICD-10-CM

## 2019-03-04 DIAGNOSIS — M85851 Other specified disorders of bone density and structure, right thigh: Secondary | ICD-10-CM

## 2019-04-02 ENCOUNTER — Encounter: Payer: Self-pay | Admitting: Cardiovascular Disease

## 2019-04-02 ENCOUNTER — Ambulatory Visit (INDEPENDENT_AMBULATORY_CARE_PROVIDER_SITE_OTHER): Payer: Medicare Other | Admitting: Cardiovascular Disease

## 2019-04-02 ENCOUNTER — Other Ambulatory Visit: Payer: Self-pay

## 2019-04-02 VITALS — BP 110/78 | HR 57 | Temp 94.9°F | Ht 64.0 in | Wt 189.0 lb

## 2019-04-02 DIAGNOSIS — I5032 Chronic diastolic (congestive) heart failure: Secondary | ICD-10-CM | POA: Diagnosis not present

## 2019-04-02 DIAGNOSIS — I872 Venous insufficiency (chronic) (peripheral): Secondary | ICD-10-CM | POA: Diagnosis not present

## 2019-04-02 DIAGNOSIS — I10 Essential (primary) hypertension: Secondary | ICD-10-CM

## 2019-04-02 NOTE — Patient Instructions (Signed)
Medication Instructions:  No changes *If you need a refill on your cardiac medications before your next appointment, please call your pharmacy*  Lab Work: None ordered If you have labs (blood work) drawn today and your tests are completely normal, you will receive your results only by: . MyChart Message (if you have MyChart) OR . A paper copy in the mail If you have any lab test that is abnormal or we need to change your treatment, we will call you to review the results.  Testing/Procedures: None ordered  Follow-Up: At [facility], you and your health needs are our priority.  As part of our continuing mission to provide you with exceptional heart care, we have created designated Provider Care Teams.  These Care Teams include your primary Cardiologist (physician) and Advanced Practice Providers (APPs -  Physician Assistants and Nurse Practitioners) who all work together to provide you with the care you need, when you need it.  Your next appointment:   12 month(s)  The format for your next appointment:   In Person  Provider:   You may see Mihai Croitoru, MD or one of the following Advanced Practice Providers on your designated Care Team:    Hao Meng, PA-C  Angela Duke, PA-C or   Krista Kroeger, PA-C  

## 2019-04-02 NOTE — Progress Notes (Signed)
Cardiology Office Note    Date:  04/02/2019   ID:  Gizelle, Zigman [birth year], MRN [id number]  PCP:  Shirline Frees, MD  Cardiologist:   Sanda Klein, MD   Chief Complaint  Patient presents with  . Hypertension    History of Present Illness:  Kaitlin [name] is a 85 y.o. female with severe HTN and hypertensive heart disease (LVH, Diastolic dysfunction by echo criteria, without clinical heart failure), peripheral venous insufficiency of the lower extremities, chronic right bundle branch block, obesity, borderline diabetes mellitus, presenting for routine follow-up.  The patient specifically denies any chest pain at rest exertion, dyspnea at rest or with exertion, orthopnea, paroxysmal nocturnal dyspnea, syncope, palpitations, focal neurological deficits, intermittent claudication, unexplained weight gain, cough, hemoptysis or wheezing. Occasional ankle swelling, always resolves overnight.  She did not tolerate higher doses of amlodipine due to edema.  Past Medical History:  Diagnosis Date  . Arthritis   . Borderline diabetes mellitus   . HHD (hypertensive heart disease)   . Hypertension   . Mild diastolic dysfunction   . Mild obesity   . RBBB     Past Surgical History:  Procedure Laterality Date  . ABDOMINAL HYSTERECTOMY  1986  . CATARACT EXTRACTION W/ INTRAOCULAR LENS  IMPLANT, BILATERAL  2013  . COLON SURGERY  2006  . stress myoview  04/26/2007   No ischemia  . US ECHOCARDIOGRAPHY  04/26/2007   mild MR & TR,LA mildly dilated,EF =>55%    Current Medications: Outpatient Medications Prior to Visit  Medication Sig Dispense Refill  . Acetaminophen (TYLENOL 8 HOUR PO) Take 1 tablet by mouth 3 times/day as needed-between meals & bedtime.    [name] amLODipine (NORVASC) 2.5 MG tablet Take 1 tablet by mouth daily.  0  . aspirin 81 MG tablet Take 81 mg by mouth daily.    . cloNIDine (CATAPRES) 0.1 MG tablet Take 1 tablet by mouth Daily.    . fluticasone (FLONASE) 50 MCG/ACT nasal  spray Place 2 sprays into both nostrils 3 times/day as needed-between meals & bedtime.     [name] KLOR-CON M20 20 MEQ tablet Take 1 tablet by mouth Daily.    [name] MICARDIS HCT 80-12.5 MG per tablet Take 1 tablet by mouth Daily.    . Multiple Vitamin (MULTIVITAMIN) tablet Take 1 tablet by mouth daily.    [name] terazosin (HYTRIN) 5 MG capsule Take 5 mg by mouth at bedtime.    . torsemide (DEMADEX) 20 MG tablet Take 1 tablet by mouth Daily.    . traMADol (ULTRAM) 50 MG tablet Take 50 mg by mouth daily.      No facility-administered medications prior to visit.     Allergies:   Patient has no known allergies.   Social History   Socioeconomic History  . Marital status: Married    Spouse name: Not on file  . Number of children: Not on file  . Years of education: Not on file  . Highest education level: Not on file  Occupational History  . Not on file  Tobacco Use  . Smoking status: Never Smoker  . Smokeless tobacco: Never Used  Substance and Sexual Activity  . Alcohol use: Yes    Alcohol/week: 1.0 standard drinks    Types: 1 Glasses of wine per week  . Drug use: No  . Sexual activity: Not on file  Other Topics Concern  . Not on file  Social History Narrative  . Not on file   Social Determinants of  Health   Financial Resource Strain:   . Difficulty of Paying Living Expenses: Not on file  Food Insecurity:   . Worried About Charity fundraiser in the Last Year: Not on file  . Ran Out of Food in the Last Year: Not on file  Transportation Needs:   . Lack of Transportation (Medical): Not on file  . Lack of Transportation (Non-Medical): Not on file  Physical Activity:   . Days of Exercise per Week: Not on file  . Minutes of Exercise per Session: Not on file  Stress:   . Feeling of Stress : Not on file  Social Connections:   . Frequency of Communication with Friends and Family: Not on file  . Frequency of Social Gatherings with Friends and Family: Not on file  . Attends Religious Services:  Not on file  . Active Member of Clubs or Organizations: Not on file  . Attends Archivist Meetings: Not on file  . Marital Status: Not on file     Family History:  The patient's family history includes Cancer in her brother, maternal grandmother, sister, and sister; Colon cancer (age of onset: 101) in her sister; Diabetes in her sister; Heart attack in her brother and mother; Heart failure in her maternal grandfather and paternal grandmother; Hypertension in her brother and sister; Kidney disease in her paternal grandfather; Stroke in her father.   ROS:   Please see the history of present illness.    ROS All other systems reviewed and are negative.   PHYSICAL EXAM:   VS:  BP 110/78   Pulse (!) 57   Temp (!) 94.9 F (34.9 C)   Ht 5\' 4"  (1.626 m)   Wt 189 lb (85.7 kg)   SpO2 100%   BMI 32.44 kg/m     General: Alert, oriented x3, no distress, mildly obese Head: no evidence of trauma, PERRL, EOMI, no exophtalmos or lid lag, no myxedema, no xanthelasma; normal ears, nose and oropharynx Neck: normal jugular venous pulsations and no hepatojugular reflux; brisk carotid pulses without delay and no carotid bruits Chest: clear to auscultation, no signs of consolidation by percussion or palpation, normal fremitus, symmetrical and full respiratory excursions Cardiovascular: normal position and quality of the apical impulse, regular rhythm, normal first and widely split widely split widely split second heart sounds, no murmurs, rubs or gallops Abdomen: no tenderness or distention, no masses by palpation, no abnormal pulsatility or arterial bruits, normal bowel sounds, no hepatosplenomegaly Extremities: no clubbing, cyanosis or edema; 2+ radial, ulnar and brachial pulses bilaterally; 2+ right femoral, posterior tibial and dorsalis pedis pulses; 2+ left femoral, posterior tibial and dorsalis pedis pulses; no subclavian or femoral bruits Neurological: grossly nonfocal Psych: Normal mood and  affect   Wt Readings from Last 3 Encounters:  04/02/19 189 lb (85.7 kg)  01/23/19 190 lb (86.2 kg)  01/09/19 187 lb (84.8 kg)      Studies/Labs Reviewed:   EKG:  EKG is not ordered today.   Recent Labs: 07/13/2016 Hemoglobin A1c 6.1%, normal LFTs, creatinine 1.07, potassium 4.1, TSH 1.06 Cholesterol 189, HDL 82, LDL 95, triglycerides 58  ASSESSMENT:    No diagnosis found.   PLAN:  In order of problems listed above:  1. HTN: Excellent control 2. LVH/ diastolic dysfunction: Does not have exertional dyspnea and appears clinically euvolemic 3. Venous Insufficiency: Avoid higher doses of calcium channel blocker, keep legs elevated, compression stockings. 4. RBBB: Previous echo did not show evidence of right heart dysfunction  or dilation.    Medication Adjustments/Labs and Tests Ordered: Current medicines are reviewed at length with the patient today.  Concerns regarding medicines are outlined above.  Medication changes, Labs and Tests ordered today are listed in the Patient Instructions below. Patient Instructions  Medication Instructions:  No changes *If you need a refill on your cardiac medications before your next appointment, please call your pharmacy*  Lab Work: None ordered If you have labs (blood work) drawn today and your tests are completely normal, you will receive your results only by: [name] MyChart Message (if you have MyChart) OR . A paper copy in the mail If you have any lab test that is abnormal or we need to change your treatment, we will call you to review the results.  Testing/Procedures: None ordered  Follow-Up: At [facility], you and your health needs are our priority.  As part of our continuing mission to provide you with exceptional heart care, we have created designated Provider Care Teams.  These Care Teams include your primary Cardiologist (physician) and Advanced Practice Providers (APPs -  Physician Assistants and Nurse Practitioners) who all  work together to provide you with the care you need, when you need it.  Your next appointment:   12 month(s)  The format for your next appointment:   In Person  Provider:   You may see Sanda Klein, MD or one of the following Advanced Practice Providers on your designated Care Team:    Almyra Deforest, PA-C  Fabian Sharp, [place] or   Roby Lofts, PA-C      Signed, Sanda Klein, MD  04/02/2019 1:50 PM    [address], [place], [place]  [address] Phone: [phone]; Fax: [phone]

## 2019-04-30 ENCOUNTER — Ambulatory Visit: Payer: Medicare Other | Admitting: Cardiovascular Disease

## 2019-09-16 ENCOUNTER — Ambulatory Visit: Payer: Medicare Other | Admitting: Internal Medicine

## 2019-09-25 ENCOUNTER — Ambulatory Visit: Payer: Medicare Other | Admitting: Internal Medicine

## 2020-02-27 DIAGNOSIS — I10 Essential (primary) hypertension: Secondary | ICD-10-CM | POA: Diagnosis not present

## 2020-02-27 DIAGNOSIS — N183 Chronic kidney disease, stage 3 unspecified: Secondary | ICD-10-CM | POA: Diagnosis not present

## 2020-02-27 DIAGNOSIS — E1142 Type 2 diabetes mellitus with diabetic polyneuropathy: Secondary | ICD-10-CM | POA: Diagnosis not present

## 2020-02-27 DIAGNOSIS — D509 Iron deficiency anemia, unspecified: Secondary | ICD-10-CM | POA: Diagnosis not present

## 2020-02-27 DIAGNOSIS — E7800 Pure hypercholesterolemia, unspecified: Secondary | ICD-10-CM | POA: Diagnosis not present

## 2020-02-27 DIAGNOSIS — M1990 Unspecified osteoarthritis, unspecified site: Secondary | ICD-10-CM | POA: Diagnosis not present

## 2020-02-27 DIAGNOSIS — E119 Type 2 diabetes mellitus without complications: Secondary | ICD-10-CM | POA: Diagnosis not present

## 2020-03-02 DIAGNOSIS — N183 Chronic kidney disease, stage 3 unspecified: Secondary | ICD-10-CM | POA: Diagnosis not present

## 2020-03-02 DIAGNOSIS — I10 Essential (primary) hypertension: Secondary | ICD-10-CM | POA: Diagnosis not present

## 2020-03-02 DIAGNOSIS — I872 Venous insufficiency (chronic) (peripheral): Secondary | ICD-10-CM | POA: Diagnosis not present

## 2020-03-02 DIAGNOSIS — E119 Type 2 diabetes mellitus without complications: Secondary | ICD-10-CM | POA: Diagnosis not present

## 2020-03-02 DIAGNOSIS — E7800 Pure hypercholesterolemia, unspecified: Secondary | ICD-10-CM | POA: Diagnosis not present

## 2020-03-02 DIAGNOSIS — M48062 Spinal stenosis, lumbar region with neurogenic claudication: Secondary | ICD-10-CM | POA: Diagnosis not present

## 2020-03-02 DIAGNOSIS — Z0000 Encounter for general adult medical examination without abnormal findings: Secondary | ICD-10-CM | POA: Diagnosis not present

## 2020-03-16 DIAGNOSIS — D509 Iron deficiency anemia, unspecified: Secondary | ICD-10-CM | POA: Diagnosis not present

## 2020-03-16 DIAGNOSIS — E7800 Pure hypercholesterolemia, unspecified: Secondary | ICD-10-CM | POA: Diagnosis not present

## 2020-03-16 DIAGNOSIS — E1142 Type 2 diabetes mellitus with diabetic polyneuropathy: Secondary | ICD-10-CM | POA: Diagnosis not present

## 2020-03-16 DIAGNOSIS — E119 Type 2 diabetes mellitus without complications: Secondary | ICD-10-CM | POA: Diagnosis not present

## 2020-03-16 DIAGNOSIS — M1990 Unspecified osteoarthritis, unspecified site: Secondary | ICD-10-CM | POA: Diagnosis not present

## 2020-03-16 DIAGNOSIS — I10 Essential (primary) hypertension: Secondary | ICD-10-CM | POA: Diagnosis not present

## 2020-03-16 DIAGNOSIS — N183 Chronic kidney disease, stage 3 unspecified: Secondary | ICD-10-CM | POA: Diagnosis not present

## 2020-03-26 DIAGNOSIS — M5416 Radiculopathy, lumbar region: Secondary | ICD-10-CM | POA: Diagnosis not present

## 2020-04-30 ENCOUNTER — Other Ambulatory Visit: Payer: Self-pay

## 2020-04-30 ENCOUNTER — Encounter: Payer: Self-pay | Admitting: Cardiovascular Disease

## 2020-04-30 ENCOUNTER — Ambulatory Visit (INDEPENDENT_AMBULATORY_CARE_PROVIDER_SITE_OTHER): Payer: Medicare Other | Admitting: Cardiovascular Disease

## 2020-04-30 VITALS — BP 144/86 | HR 68 | Ht 64.0 in | Wt 181.0 lb

## 2020-04-30 DIAGNOSIS — I4510 Unspecified right bundle-branch block: Secondary | ICD-10-CM

## 2020-04-30 DIAGNOSIS — I10 Essential (primary) hypertension: Secondary | ICD-10-CM | POA: Diagnosis not present

## 2020-04-30 DIAGNOSIS — I5032 Chronic diastolic (congestive) heart failure: Secondary | ICD-10-CM

## 2020-04-30 DIAGNOSIS — I872 Venous insufficiency (chronic) (peripheral): Secondary | ICD-10-CM

## 2020-04-30 NOTE — Progress Notes (Signed)
Cardiology Office Note    Date:  04/30/2020   ID:  Kaitlin [name], [place] [birth year], MRN [id number]  PCP:  Shirline Frees, MD  Cardiologist:   Sanda Klein, MD   Chief Complaint  Patient presents with  . Hypertension    History of Present Illness:  Kaitlin [name] is a 86 y.o. female with severe HTN and hypertensive heart disease (LVH, Diastolic dysfunction by echo criteria, without clinical heart failure), peripheral venous insufficiency of the lower extremities, chronic right bundle branch block, obesity, borderline diabetes mellitus, presenting for routine follow-up.  She is doing well from a cardiovascular standpoint.  Most of her recent complaints centered around the new diagnosis of lumbar spine stenosis.  She is not using a cane or walker but has pain in her back and legs most of the day.  Tramadol helps.  She was prescribed gabapentin but this made her feel "sick".  She is received a prescription for Lyrica but has not started it yet.  She had some problems with dizziness and low blood pressures longstanding and her Terazosin and has been discontinued.  Currently her typical blood pressure at home is in the 130s-140s/80s.  Occasionally sees systolic blood pressure around 160, but she does not always wait and relax before checking her blood pressure.  The patient specifically denies any chest pain at rest exertion, dyspnea at rest or with exertion, orthopnea, paroxysmal nocturnal dyspnea, syncope, palpitations, focal neurological deficits, intermittent claudication, lower extremity edema, unexplained weight gain, cough, hemoptysis or wheezing.   Past Medical History:  Diagnosis Date  . Arthritis   . Borderline diabetes mellitus   . HHD (hypertensive heart disease)   . Hypertension   . Mild diastolic dysfunction   . Mild obesity   . RBBB     Past Surgical History:  Procedure Laterality Date  . ABDOMINAL HYSTERECTOMY  1986  . CATARACT EXTRACTION W/ INTRAOCULAR LENS  IMPLANT,  BILATERAL  2013  . COLON SURGERY  2006  . stress myoview  04/26/2007   No ischemia  . US ECHOCARDIOGRAPHY  04/26/2007   mild MR & TR,LA mildly dilated,EF =>55%    Current Medications: Outpatient Medications Prior to Visit  Medication Sig Dispense Refill  . Acetaminophen (TYLENOL 8 HOUR PO) Take 1 tablet by mouth 3 times/day as needed-between meals & bedtime.    [name] amLODipine (NORVASC) 5 MG tablet Take 1 tablet by mouth daily.    [name] aspirin 81 MG tablet Take 81 mg by mouth daily.    . cloNIDine (CATAPRES) 0.1 MG tablet Take 1 tablet by mouth Daily.    . fluticasone (FLONASE) 50 MCG/ACT nasal spray Place 2 sprays into both nostrils 3 times/day as needed-between meals & bedtime.     [name] KLOR-CON M20 20 MEQ tablet Take 1 tablet by mouth Daily.    . Multiple Vitamins-Iron (DAILY MULTIVITAMINS/IRON PO) 1 tablet    . pregabalin (LYRICA) 50 MG capsule Take 50 mg by mouth 2 (two) times daily.    [name] telmisartan (MICARDIS) 80 MG tablet Take 80 mg by mouth daily.    [name] terazosin (HYTRIN) 5 MG capsule Take 5 mg by mouth at bedtime.    . torsemide (DEMADEX) 20 MG tablet Take 1 tablet by mouth Daily.    . traMADol (ULTRAM) 50 MG tablet Take 50 mg by mouth daily.     [name] amLODipine (NORVASC) 2.5 MG tablet Take 1 tablet by mouth daily.  0  . MICARDIS HCT 80-12.5 MG per tablet Take 1  tablet by mouth Daily.    . Multiple Vitamin (MULTIVITAMIN) tablet Take 1 tablet by mouth daily.     No facility-administered medications prior to visit.     Allergies:   Amitriptyline hcl and Gabapentin   Social History   Socioeconomic History  . Marital status: Married    Spouse name: Not on file  . Number of children: Not on file  . Years of education: Not on file  . Highest education level: Not on file  Occupational History  . Not on file  Tobacco Use  . Smoking status: Never Smoker  . Smokeless tobacco: Never Used  Substance and Sexual Activity  . Alcohol use: Yes    Alcohol/week: 1.0 standard drink    Types: 1  Glasses of wine per week  . Drug use: No  . Sexual activity: Not on file  Other Topics Concern  . Not on file  Social History Narrative  . Not on file   Social Determinants of Health   Financial Resource Strain: Not on file  Food Insecurity: Not on file  Transportation Needs: Not on file  Physical Activity: Not on file  Stress: Not on file  Social Connections: Not on file     Family History:  The patient's family history includes Cancer in her brother, maternal grandmother, sister, and sister; Colon cancer (age of onset: 2) in her sister; Diabetes in her sister; Heart attack in her brother and mother; Heart failure in her maternal grandfather and paternal grandmother; Hypertension in her brother and sister; Kidney disease in her paternal grandfather; Stroke in her father.   ROS:   Please see the history of present illness.    ROS All other systems reviewed and are negative.   PHYSICAL EXAM:   VS:  BP (!) 144/86   Pulse 68   Ht 5\' 4"  (1.626 m)   Wt 181 lb (82.1 kg)   SpO2 99%   BMI 31.07 kg/m      General: Alert, oriented x3, no distress, mildly obese Head: no evidence of trauma, PERRL, EOMI, no exophtalmos or lid lag, no myxedema, no xanthelasma; normal ears, nose and oropharynx Neck: normal jugular venous pulsations and no hepatojugular reflux; brisk carotid pulses without delay and no carotid bruits Chest: clear to auscultation, no signs of consolidation by percussion or palpation, normal fremitus, symmetrical and full respiratory excursions Cardiovascular: normal position and quality of the apical impulse, regular rhythm, normal first and widely split second heart sounds, no murmurs, rubs or gallops Abdomen: no tenderness or distention, no masses by palpation, no abnormal pulsatility or arterial bruits, normal bowel sounds, no hepatosplenomegaly Extremities: no clubbing, cyanosis or edema; 2+ radial, ulnar and brachial pulses bilaterally; 2+ right femoral, posterior  tibial and dorsalis pedis pulses; 2+ left femoral, posterior tibial and dorsalis pedis pulses; no subclavian or femoral bruits Neurological: grossly nonfocal Psych: Normal mood and affect    Wt Readings from Last 3 Encounters:  04/30/20 181 lb (82.1 kg)  04/02/19 189 lb (85.7 kg)  01/23/19 190 lb (86.2 kg)      Studies/Labs Reviewed:   EKG:  EKG is ordered today.  It shows sinus rhythm with a single PVC, right bundle branch block.  Appropriately mildly prolonged QTC 490 ms  Recent Labs: 07/13/2016 Hemoglobin A1c 6.1%, normal LFTs, creatinine 1.07, potassium 4.1, TSH 1.06 Cholesterol 189, HDL 82, LDL 95, triglycerides 58  03/02/2020 HDL 93, LDL 103, total cholesterol 206, triglycerides 54 Hemoglobin A1c 6.1%, fasting glucose 128, creatinine 1.2, BUN  21, TSH 1.38  ASSESSMENT:    1. Essential hypertension   2. Chronic diastolic heart failure ([place])   3. Peripheral venous insufficiency   4. RBBB      PLAN:  In order of problems listed above:  1. HTN: Terazosin stopped due to orthostatic hypotension.  No longer on hydrochlorothiazide, but takes a daily dose of torsemide.  I think her blood pressure is in desirable range (140/90 or less), and encourage her to always wait 10-15 minutes in a sitting position before checking her blood sugar.  If her systolic blood pressures frequently over 140, we will have to reevaluate her treatment regimen. 2. LVH/ diastolic dysfunction: Good functional status, clinically euvolemic on a very low-dose of loop diuretic.  Functional limitation from back pain rather than cardiovascular complaints. 3. Venous Insufficiency: Currently without complaints of swelling.  Avoid higher doses of calcium channel blockers. 4. RBBB: No structural abnormality correlate on previous echocardiogram.    Medication Adjustments/Labs and Tests Ordered: Current medicines are reviewed at length with the patient today.  Concerns regarding medicines are outlined above.   Medication changes, Labs and Tests ordered today are listed in the Patient Instructions below. Patient Instructions  Medication Instructions:  No changes. Please call us with the correct dosage of the Amlodipine  *If you need a refill on your cardiac medications before your next appointment, please call your pharmacy*   Lab Work: None ordered If you have labs (blood work) drawn today and your tests are completely normal, you will receive your results only by: [name] MyChart Message (if you have MyChart) OR . A paper copy in the mail If you have any lab test that is abnormal or we need to change your treatment, we will call you to review the results.   Testing/Procedures: None ordered   Follow-Up: At [facility], you and your health needs are our priority.  As part of our continuing mission to provide you with exceptional heart care, we have created designated Provider Care Teams.  These Care Teams include your primary Cardiologist (physician) and Advanced Practice Providers (APPs -  Physician Assistants and Nurse Practitioners) who all work together to provide you with the care you need, when you need it.  We recommend signing up for the patient portal called "MyChart".  Sign up information is provided on this After Visit Summary.  MyChart is used to connect with patients for Virtual Visits (Telemedicine).  Patients are able to view lab/test results, encounter notes, upcoming appointments, etc.  Non-urgent messages can be sent to your provider as well.   To learn more about what you can do with MyChart, go to [email or web address].    Your next appointment:   12 month(s)  The format for your next appointment:   In Person  Provider:   You may see Sanda Klein, MD or one of the following Advanced Practice Providers on your designated Care Team:    Almyra Deforest, PA-C  Fabian Sharp, [place] or   Roby Lofts, PA-C       Signed, Sanda Klein, MD  04/30/2020 9:33 AM    [address], [place], [place]  [address] Phone: [phone]; Fax: [phone]

## 2020-04-30 NOTE — Patient Instructions (Signed)
Medication Instructions:  No changes. Please call us with the correct dosage of the Amlodipine  *If you need a refill on your cardiac medications before your next appointment, please call your pharmacy*   Lab Work: None ordered If you have labs (blood work) drawn today and your tests are completely normal, you will receive your results only by: [name] MyChart Message (if you have MyChart) OR . A paper copy in the mail If you have any lab test that is abnormal or we need to change your treatment, we will call you to review the results.   Testing/Procedures: None ordered   Follow-Up: At [facility], you and your health needs are our priority.  As part of our continuing mission to provide you with exceptional heart care, we have created designated Provider Care Teams.  These Care Teams include your primary Cardiologist (physician) and Advanced Practice Providers (APPs -  Physician Assistants and Nurse Practitioners) who all work together to provide you with the care you need, when you need it.  We recommend signing up for the patient portal called "MyChart".  Sign up information is provided on this After Visit Summary.  MyChart is used to connect with patients for Virtual Visits (Telemedicine).  Patients are able to view lab/test results, encounter notes, upcoming appointments, etc.  Non-urgent messages can be sent to your provider as well.   To learn more about what you can do with MyChart, go to [email or web address].    Your next appointment:   12 month(s)  The format for your next appointment:   In Person  Provider:   You may see Sanda Klein, MD or one of the following Advanced Practice Providers on your designated Care Team:    Almyra Deforest, PA-C  Fabian Sharp, PA-C or   Roby Lofts, [place]

## 2020-05-05 DIAGNOSIS — Z780 Asymptomatic menopausal state: Secondary | ICD-10-CM | POA: Diagnosis not present

## 2020-05-05 DIAGNOSIS — Z1231 Encounter for screening mammogram for malignant neoplasm of breast: Secondary | ICD-10-CM | POA: Diagnosis not present

## 2020-05-05 DIAGNOSIS — Z13820 Encounter for screening for osteoporosis: Secondary | ICD-10-CM | POA: Diagnosis not present

## 2020-05-27 DIAGNOSIS — D509 Iron deficiency anemia, unspecified: Secondary | ICD-10-CM | POA: Diagnosis not present

## 2020-05-27 DIAGNOSIS — E119 Type 2 diabetes mellitus without complications: Secondary | ICD-10-CM | POA: Diagnosis not present

## 2020-05-27 DIAGNOSIS — I10 Essential (primary) hypertension: Secondary | ICD-10-CM | POA: Diagnosis not present

## 2020-05-27 DIAGNOSIS — E1142 Type 2 diabetes mellitus with diabetic polyneuropathy: Secondary | ICD-10-CM | POA: Diagnosis not present

## 2020-05-27 DIAGNOSIS — M1990 Unspecified osteoarthritis, unspecified site: Secondary | ICD-10-CM | POA: Diagnosis not present

## 2020-05-27 DIAGNOSIS — N183 Chronic kidney disease, stage 3 unspecified: Secondary | ICD-10-CM | POA: Diagnosis not present

## 2020-05-27 DIAGNOSIS — E7800 Pure hypercholesterolemia, unspecified: Secondary | ICD-10-CM | POA: Diagnosis not present

## 2020-06-15 DIAGNOSIS — M5416 Radiculopathy, lumbar region: Secondary | ICD-10-CM | POA: Diagnosis not present

## 2020-06-29 DIAGNOSIS — R7303 Prediabetes: Secondary | ICD-10-CM | POA: Diagnosis not present

## 2020-06-29 DIAGNOSIS — Z008 Encounter for other general examination: Secondary | ICD-10-CM | POA: Diagnosis not present

## 2020-06-29 DIAGNOSIS — Z0000 Encounter for general adult medical examination without abnormal findings: Secondary | ICD-10-CM | POA: Diagnosis not present

## 2020-06-29 DIAGNOSIS — M48061 Spinal stenosis, lumbar region without neurogenic claudication: Secondary | ICD-10-CM | POA: Diagnosis not present

## 2020-06-29 DIAGNOSIS — I10 Essential (primary) hypertension: Secondary | ICD-10-CM | POA: Diagnosis not present

## 2020-06-29 DIAGNOSIS — I872 Venous insufficiency (chronic) (peripheral): Secondary | ICD-10-CM | POA: Diagnosis not present

## 2020-07-19 DIAGNOSIS — M47816 Spondylosis without myelopathy or radiculopathy, lumbar region: Secondary | ICD-10-CM | POA: Diagnosis not present

## 2020-07-29 DIAGNOSIS — M47816 Spondylosis without myelopathy or radiculopathy, lumbar region: Secondary | ICD-10-CM | POA: Diagnosis not present

## 2020-08-27 DIAGNOSIS — M48062 Spinal stenosis, lumbar region with neurogenic claudication: Secondary | ICD-10-CM | POA: Diagnosis not present

## 2020-08-30 DIAGNOSIS — E7800 Pure hypercholesterolemia, unspecified: Secondary | ICD-10-CM | POA: Diagnosis not present

## 2020-08-30 DIAGNOSIS — E1142 Type 2 diabetes mellitus with diabetic polyneuropathy: Secondary | ICD-10-CM | POA: Diagnosis not present

## 2020-08-30 DIAGNOSIS — I10 Essential (primary) hypertension: Secondary | ICD-10-CM | POA: Diagnosis not present

## 2020-08-30 DIAGNOSIS — M48062 Spinal stenosis, lumbar region with neurogenic claudication: Secondary | ICD-10-CM | POA: Diagnosis not present

## 2020-08-30 DIAGNOSIS — R600 Localized edema: Secondary | ICD-10-CM | POA: Diagnosis not present

## 2020-08-30 DIAGNOSIS — N183 Chronic kidney disease, stage 3 unspecified: Secondary | ICD-10-CM | POA: Diagnosis not present

## 2020-09-08 DIAGNOSIS — M48062 Spinal stenosis, lumbar region with neurogenic claudication: Secondary | ICD-10-CM | POA: Diagnosis not present

## 2020-09-28 DIAGNOSIS — M48062 Spinal stenosis, lumbar region with neurogenic claudication: Secondary | ICD-10-CM | POA: Diagnosis not present

## 2020-11-17 ENCOUNTER — Other Ambulatory Visit: Payer: Self-pay

## 2020-11-17 ENCOUNTER — Ambulatory Visit (INDEPENDENT_AMBULATORY_CARE_PROVIDER_SITE_OTHER): Payer: Medicare Other

## 2020-11-17 ENCOUNTER — Ambulatory Visit: Payer: Medicare Other | Admitting: Podiatry

## 2020-11-17 ENCOUNTER — Encounter: Payer: Self-pay | Admitting: Podiatry

## 2020-11-17 VITALS — BP 132/70 | HR 66 | Temp 98.1°F

## 2020-11-17 DIAGNOSIS — M21619 Bunion of unspecified foot: Secondary | ICD-10-CM | POA: Diagnosis not present

## 2020-11-17 DIAGNOSIS — M21611 Bunion of right foot: Secondary | ICD-10-CM

## 2020-11-17 DIAGNOSIS — L84 Corns and callosities: Secondary | ICD-10-CM | POA: Diagnosis not present

## 2020-11-17 NOTE — Progress Notes (Signed)
  Subjective:  Patient ID: Kaitlin [name], female    DOB: 1934-04-10,   MRN: [id number]  No chief complaint on file.   86 y.o. female presents for concern for bunion on right foot. This has been present  many years and never bothered her. Relates a family history of bunions. States she has been walking more and like to wear leather shoes. Denies any treatment..  . Denies any other pedal complaints. Denies n/v/f/c.   Past Medical History:  Diagnosis Date   Arthritis    Borderline diabetes mellitus    HHD (hypertensive heart disease)    Hypertension    Mild diastolic dysfunction    Mild obesity    RBBB     Objective:  Physical Exam: Vascular: DP/PT pulses 2/4 bilateral. CFT <3 seconds. Normal hair growth on digits. No edema.  Skin. No lacerations or abrasions bilateral feet. Hyperkeratotic tissue noted to medial eminence of right foot.  Musculoskeletal: MMT 5/5 bilateral lower extremities in DF, PF, Inversion and Eversion. Deceased ROM in DF of ankle joint. 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   Neurological: Sensation intact to light touch.   Assessment:   1. Bunion, right foot   2. Callus of foot      Plan:  Patient was evaluated and treated and all questions answered. -Xrays reviewed. No acute fractures or dislocations. Moderate to sever increased in HAV angle with second digit hammertoe noted. Osteopenic changes noted.  -Discussed HAV and treatment options;conservative and surgical management; risks, benefits, alternatives discussed. All patient's questions answered. -Discussed padding and wide shoe gear.   -Hyperkeratotic tissue debrided.  -Recommend continue with good supportive shoes and inserts.  -Discussed surgical options.  -Patient to return to office as needed or sooner if condition worsens.   Lorenda Peck, DPM

## 2020-11-30 DIAGNOSIS — D509 Iron deficiency anemia, unspecified: Secondary | ICD-10-CM | POA: Diagnosis not present

## 2020-11-30 DIAGNOSIS — N183 Chronic kidney disease, stage 3 unspecified: Secondary | ICD-10-CM | POA: Diagnosis not present

## 2020-11-30 DIAGNOSIS — M1990 Unspecified osteoarthritis, unspecified site: Secondary | ICD-10-CM | POA: Diagnosis not present

## 2020-11-30 DIAGNOSIS — E1142 Type 2 diabetes mellitus with diabetic polyneuropathy: Secondary | ICD-10-CM | POA: Diagnosis not present

## 2020-11-30 DIAGNOSIS — I10 Essential (primary) hypertension: Secondary | ICD-10-CM | POA: Diagnosis not present

## 2020-11-30 DIAGNOSIS — E119 Type 2 diabetes mellitus without complications: Secondary | ICD-10-CM | POA: Diagnosis not present

## 2020-11-30 DIAGNOSIS — E7800 Pure hypercholesterolemia, unspecified: Secondary | ICD-10-CM | POA: Diagnosis not present

## 2021-01-17 DIAGNOSIS — M48062 Spinal stenosis, lumbar region with neurogenic claudication: Secondary | ICD-10-CM | POA: Diagnosis not present

## 2021-03-08 DIAGNOSIS — E7800 Pure hypercholesterolemia, unspecified: Secondary | ICD-10-CM | POA: Diagnosis not present

## 2021-03-08 DIAGNOSIS — M48062 Spinal stenosis, lumbar region with neurogenic claudication: Secondary | ICD-10-CM | POA: Diagnosis not present

## 2021-03-08 DIAGNOSIS — N183 Chronic kidney disease, stage 3 unspecified: Secondary | ICD-10-CM | POA: Diagnosis not present

## 2021-03-08 DIAGNOSIS — I10 Essential (primary) hypertension: Secondary | ICD-10-CM | POA: Diagnosis not present

## 2021-03-08 DIAGNOSIS — Z0000 Encounter for general adult medical examination without abnormal findings: Secondary | ICD-10-CM | POA: Diagnosis not present

## 2021-03-08 DIAGNOSIS — E1142 Type 2 diabetes mellitus with diabetic polyneuropathy: Secondary | ICD-10-CM | POA: Diagnosis not present

## 2021-03-08 DIAGNOSIS — R600 Localized edema: Secondary | ICD-10-CM | POA: Diagnosis not present

## 2021-03-31 DIAGNOSIS — M48062 Spinal stenosis, lumbar region with neurogenic claudication: Secondary | ICD-10-CM | POA: Diagnosis not present

## 2021-05-11 DIAGNOSIS — Z1231 Encounter for screening mammogram for malignant neoplasm of breast: Secondary | ICD-10-CM | POA: Diagnosis not present

## 2021-05-16 DIAGNOSIS — I10 Essential (primary) hypertension: Secondary | ICD-10-CM | POA: Diagnosis not present

## 2021-05-24 DIAGNOSIS — E1142 Type 2 diabetes mellitus with diabetic polyneuropathy: Secondary | ICD-10-CM | POA: Diagnosis not present

## 2021-05-24 DIAGNOSIS — I5032 Chronic diastolic (congestive) heart failure: Secondary | ICD-10-CM | POA: Diagnosis not present

## 2021-05-24 DIAGNOSIS — E7800 Pure hypercholesterolemia, unspecified: Secondary | ICD-10-CM | POA: Diagnosis not present

## 2021-05-24 DIAGNOSIS — I10 Essential (primary) hypertension: Secondary | ICD-10-CM | POA: Diagnosis not present

## 2021-05-31 ENCOUNTER — Ambulatory Visit (INDEPENDENT_AMBULATORY_CARE_PROVIDER_SITE_OTHER): Payer: Self-pay | Admitting: Podiatry

## 2021-05-31 DIAGNOSIS — Z91199 Patient's noncompliance with other medical treatment and regimen due to unspecified reason: Secondary | ICD-10-CM

## 2021-05-31 NOTE — Progress Notes (Signed)
No show

## 2021-06-01 DIAGNOSIS — H40013 Open angle with borderline findings, low risk, bilateral: Secondary | ICD-10-CM | POA: Diagnosis not present

## 2021-06-01 DIAGNOSIS — H43813 Vitreous degeneration, bilateral: Secondary | ICD-10-CM | POA: Diagnosis not present

## 2021-06-01 DIAGNOSIS — H35363 Drusen (degenerative) of macula, bilateral: Secondary | ICD-10-CM | POA: Diagnosis not present

## 2021-06-01 DIAGNOSIS — Z961 Presence of intraocular lens: Secondary | ICD-10-CM | POA: Diagnosis not present

## 2021-06-01 DIAGNOSIS — H04123 Dry eye syndrome of bilateral lacrimal glands: Secondary | ICD-10-CM | POA: Diagnosis not present

## 2021-06-09 ENCOUNTER — Ambulatory Visit: Payer: Medicare Other | Admitting: Cardiovascular Disease

## 2021-06-09 ENCOUNTER — Encounter: Payer: Self-pay | Admitting: Cardiovascular Disease

## 2021-06-09 VITALS — BP 160/88 | HR 61 | Ht 64.0 in | Wt 181.4 lb

## 2021-06-09 DIAGNOSIS — I119 Hypertensive heart disease without heart failure: Secondary | ICD-10-CM

## 2021-06-09 DIAGNOSIS — I872 Venous insufficiency (chronic) (peripheral): Secondary | ICD-10-CM | POA: Diagnosis not present

## 2021-06-09 DIAGNOSIS — I10 Essential (primary) hypertension: Secondary | ICD-10-CM

## 2021-06-09 DIAGNOSIS — I4510 Unspecified right bundle-branch block: Secondary | ICD-10-CM | POA: Diagnosis not present

## 2021-06-09 NOTE — Progress Notes (Signed)
?  ? ?Cardiology Office Note   ? ?Date:  06/12/2021  ? ?Kaitlin [name], DOB 1934-10-24, MRN [id number] ? ?PCP:  Shirline Frees, MD  ?Cardiologist:   Sanda Klein, MD  ? ?Chief Complaint  ?Patient presents with  ? Hypertension  ? ? ?History of Present Illness:  ?Kaitlin [name] is a 87 y.o. female with severe HTN and hypertensive heart disease (LVH, Diastolic dysfunction by echo criteria, without clinical heart failure), peripheral venous insufficiency of the lower extremities, chronic right bundle branch block, obesity, borderline diabetes mellitus, presenting for routine follow-up. ? ?She has few physical complaints, but is having a difficult time emotionally, after her husband of 24 years died about 3 months ago.  She does not have exertional angina or chest pain.  She is going to water aerobics regularly.  She complains of bilateral burning discomfort in her legs at rest.  She had normal ankle-brachial indices in 2020.  She has mild ankle swelling at the end of the day in particular.  It usually goes away after overnight supine position. ? ?Blood pressure was little high today but she is clearly very emotional.  At home her typical blood pressures in the low 130/70 range. ? ?She denies dizziness, palpitations, syncope, exertional leg pain, focal neurological events. ? ?Past Medical History:  ?Diagnosis Date  ? Arthritis   ? Borderline diabetes mellitus   ? HHD (hypertensive heart disease)   ? Hypertension   ? Mild diastolic dysfunction   ? Mild obesity   ? RBBB   ? ? ?Past Surgical History:  ?Procedure Laterality Date  ? ABDOMINAL HYSTERECTOMY  1986  ? CATARACT EXTRACTION W/ INTRAOCULAR LENS  IMPLANT, BILATERAL  2013  ? COLON SURGERY  2006  ? stress myoview  04/26/2007  ? No ischemia  ? US ECHOCARDIOGRAPHY  04/26/2007  ? mild MR & TR,LA mildly dilated,EF =>55%  ? ? ?Current Medications: ?Outpatient Medications Prior to Visit  ?Medication Sig Dispense Refill  ? Acetaminophen (TYLENOL 8 HOUR PO) Take 1 tablet by mouth  3 times/day as needed-between meals & bedtime.    ? amLODipine (NORVASC) 5 MG tablet Take 1 tablet by mouth daily.    ? aspirin 81 MG tablet Take 81 mg by mouth daily.    ? cloNIDine (CATAPRES) 0.1 MG tablet Take 1 tablet by mouth Daily.    ? fluticasone (FLONASE) 50 MCG/ACT nasal spray Place 2 sprays into both nostrils 3 times/day as needed-between meals & bedtime.     ? KLOR-CON M20 20 MEQ tablet Take 1 tablet by mouth Daily.    ? Multiple Vitamins-Iron (DAILY MULTIVITAMINS/IRON PO) 1 tablet    ? telmisartan (MICARDIS) 80 MG tablet Take 80 mg by mouth daily.    ? torsemide (DEMADEX) 20 MG tablet Take 1 tablet by mouth Daily.    ? traMADol (ULTRAM) 50 MG tablet Take 50 mg by mouth daily.     ? pregabalin (LYRICA) 50 MG capsule Take 50 mg by mouth 2 (two) times daily. (Patient not taking: Reported on 06/09/2021)    ? terazosin (HYTRIN) 5 MG capsule Take 5 mg by mouth at bedtime. (Patient not taking: Reported on 06/09/2021)    ? ?No facility-administered medications prior to visit.  ?  ? ?Allergies:   Amitriptyline hcl and Gabapentin  ? ?Social History  ? ?Socioeconomic History  ? Marital status: Married  ?  Spouse name: Not on file  ? Number of children: Not on file  ? Years of education:  Not on file  ? Highest education level: Not on file  ?Occupational History  ? Not on file  ?Tobacco Use  ? Smoking status: Never  ? Smokeless tobacco: Never  ?Substance and Sexual Activity  ? Alcohol use: Yes  ?  Alcohol/week: 1.0 standard drink  ?  Types: 1 Glasses of wine per week  ? Drug use: No  ? Sexual activity: Not on file  ?Other Topics Concern  ? Not on file  ?Social History Narrative  ? Not on file  ? ?Social Determinants of Health  ? ?Financial Resource Strain: Not on file  ?Food Insecurity: Not on file  ?Transportation Needs: Not on file  ?Physical Activity: Not on file  ?Stress: Not on file  ?Social Connections: Not on file  ?  ? ?Family History:  The patient's family history includes Cancer in her brother, maternal  grandmother, sister, and sister; Colon cancer (age of onset: 83) in her sister; Diabetes in her sister; Heart attack in her brother and mother; Heart failure in her maternal grandfather and paternal grandmother; Hypertension in her brother and sister; Kidney disease in her paternal grandfather; Stroke in her father.  ? ?ROS:   ?Please see the history of present illness.    ?ROS All other systems reviewed and are negative. ? ? ?PHYSICAL EXAM:   ?VS:  BP (!) 160/88   Pulse 61   Ht '5\' 4"'$  (1.626 m)   Wt 181 lb 6.4 oz (82.3 kg)   SpO2 98%   BMI 31.14 kg/m?    ? ? ?General: Alert, oriented x3, no distress, mildly obese ?Head: no evidence of trauma, PERRL, EOMI, no exophtalmos or lid lag, no myxedema, no xanthelasma; normal ears, nose and oropharynx ?Neck: normal jugular venous pulsations and no hepatojugular reflux; brisk carotid pulses without delay and no carotid bruits ?Chest: clear to auscultation, no signs of consolidation by percussion or palpation, normal fremitus, symmetrical and full respiratory excursions ?Cardiovascular: normal position and quality of the apical impulse, regular rhythm, normal first and second heart sounds, no murmurs, rubs or gallops ?Abdomen: no tenderness or distention, no masses by palpation, no abnormal pulsatility or arterial bruits, normal bowel sounds, no hepatosplenomegaly ?Extremities: no clubbing, cyanosis or edema; 2+ radial, ulnar and brachial pulses bilaterally; 2+ right femoral, posterior tibial and dorsalis pedis pulses; 2+ left femoral, posterior tibial and dorsalis pedis pulses; no subclavian or femoral bruits ?Neurological: grossly nonfocal ?Psych: Normal mood and affect ? ? ? ? ?Wt Readings from Last 3 Encounters:  ?06/09/21 181 lb 6.4 oz (82.3 kg)  ?04/30/20 181 lb (82.1 kg)  ?04/02/19 189 lb (85.7 kg)  ?  ? ? ?Studies/Labs Reviewed:  ? ?EKG:  EKG is ordered today.  It is very similar to previous tracings showing normal sinus rhythm and right bundle branch block with  subtle T wave inversion in the inferior leads.  QTc is 467 ms.  ?Recent Labs: ?07/13/2016 ?Hemoglobin A1c 6.1%, normal LFTs, creatinine 1.07, potassium 4.1, TSH 1.06 ?Cholesterol 189, HDL 82, LDL 95, triglycerides 58 ? ?03/02/2020 ?HDL 93, LDL 103, total cholesterol 206, triglycerides 54 ?Hemoglobin A1c 6.1%, fasting glucose 128, creatinine 1.2, BUN 21, TSH 1.38 ? ?03/08/2021 ?Cholesterol 205, HDL 101, LDL 94, triglycerides 55 ?Hemoglobin A1c 6.3%, hemoglobin 12.5 ?05/16/2021 ?Creatinine 1.09, potassium 4.1, ALT 17, TSH 0.53 ? ?ASSESSMENT:   ? ?1. Essential hypertension   ?2. Hypertensive heart disease without heart failure   ?3. Peripheral venous insufficiency   ?4. RBBB   ? ? ? ?PLAN:  ?  In order of problems listed above: ? ?HTN: Usually well controlled.  She was upset today talking about her husband's demise.  I did not change her medications.  Target BP 140/90, since she has had issues with orthostatic hypotension in the past.  Would like to avoid increasing the amlodipine since this is probably a cause for ankle edema.  She is on maximum dose of telmisartan.  Heart rate is relatively slow for beta-blockers.  We could consider adding a low-dose of spironolactone if necessary. ?LVH/ diastolic dysfunction: Other than mild ankle swelling, which is most likely due to the calcium channel blocker, she has no signs of hypervolemia.  She does not have symptoms of left heart failure. ?Venous Insufficiency: Mild peripheral edema, no stasis ulcers. ?RBBB: Chronic.  No signs or symptoms of high-grade AV block. ? ? ? ?Medication Adjustments/Labs and Tests Ordered: ?Current medicines are reviewed at length with the patient today.  Concerns regarding medicines are outlined above.  Medication changes, Labs and Tests ordered today are listed in the Patient Instructions below. ?Patient Instructions  ?Medication Instructions:  ?No changes ?*If you need a refill on your cardiac medications before your next appointment, please call  your pharmacy* ? ? ?Lab Work: ?None ordered ?If you have labs (blood work) drawn today and your tests are completely normal, you will receive your results only by: ?MyChart Message (if you have MyChart) OR ?A paper

## 2021-06-09 NOTE — Patient Instructions (Signed)

## 2021-06-12 ENCOUNTER — Encounter: Payer: Self-pay | Admitting: Cardiovascular Disease

## 2021-06-30 DIAGNOSIS — E1142 Type 2 diabetes mellitus with diabetic polyneuropathy: Secondary | ICD-10-CM | POA: Diagnosis not present

## 2021-06-30 DIAGNOSIS — I872 Venous insufficiency (chronic) (peripheral): Secondary | ICD-10-CM | POA: Diagnosis not present

## 2021-06-30 DIAGNOSIS — M48062 Spinal stenosis, lumbar region with neurogenic claudication: Secondary | ICD-10-CM | POA: Diagnosis not present

## 2021-06-30 DIAGNOSIS — E7800 Pure hypercholesterolemia, unspecified: Secondary | ICD-10-CM | POA: Diagnosis not present

## 2021-06-30 DIAGNOSIS — I10 Essential (primary) hypertension: Secondary | ICD-10-CM | POA: Diagnosis not present

## 2021-08-29 DIAGNOSIS — I872 Venous insufficiency (chronic) (peripheral): Secondary | ICD-10-CM | POA: Diagnosis not present

## 2021-08-29 DIAGNOSIS — I10 Essential (primary) hypertension: Secondary | ICD-10-CM | POA: Diagnosis not present

## 2021-08-29 DIAGNOSIS — E7800 Pure hypercholesterolemia, unspecified: Secondary | ICD-10-CM | POA: Diagnosis not present

## 2021-08-29 DIAGNOSIS — N183 Chronic kidney disease, stage 3 unspecified: Secondary | ICD-10-CM | POA: Diagnosis not present

## 2021-08-29 DIAGNOSIS — E1142 Type 2 diabetes mellitus with diabetic polyneuropathy: Secondary | ICD-10-CM | POA: Diagnosis not present

## 2021-08-29 DIAGNOSIS — M48062 Spinal stenosis, lumbar region with neurogenic claudication: Secondary | ICD-10-CM | POA: Diagnosis not present

## 2021-10-11 DIAGNOSIS — M47816 Spondylosis without myelopathy or radiculopathy, lumbar region: Secondary | ICD-10-CM | POA: Diagnosis not present

## 2021-10-19 DIAGNOSIS — M47816 Spondylosis without myelopathy or radiculopathy, lumbar region: Secondary | ICD-10-CM | POA: Diagnosis not present

## 2021-11-09 DIAGNOSIS — M47816 Spondylosis without myelopathy or radiculopathy, lumbar region: Secondary | ICD-10-CM | POA: Diagnosis not present

## 2021-11-14 DIAGNOSIS — E7800 Pure hypercholesterolemia, unspecified: Secondary | ICD-10-CM | POA: Diagnosis not present

## 2021-11-14 DIAGNOSIS — I10 Essential (primary) hypertension: Secondary | ICD-10-CM | POA: Diagnosis not present

## 2021-11-14 DIAGNOSIS — N183 Chronic kidney disease, stage 3 unspecified: Secondary | ICD-10-CM | POA: Diagnosis not present

## 2021-11-14 DIAGNOSIS — E1142 Type 2 diabetes mellitus with diabetic polyneuropathy: Secondary | ICD-10-CM | POA: Diagnosis not present

## 2021-11-14 DIAGNOSIS — I5032 Chronic diastolic (congestive) heart failure: Secondary | ICD-10-CM | POA: Diagnosis not present

## 2021-11-22 DIAGNOSIS — R260 Ataxic gait: Secondary | ICD-10-CM | POA: Diagnosis not present

## 2021-11-22 DIAGNOSIS — M5441 Lumbago with sciatica, right side: Secondary | ICD-10-CM | POA: Diagnosis not present

## 2021-11-22 DIAGNOSIS — M6281 Muscle weakness (generalized): Secondary | ICD-10-CM | POA: Diagnosis not present

## 2021-11-24 DIAGNOSIS — R260 Ataxic gait: Secondary | ICD-10-CM | POA: Diagnosis not present

## 2021-11-24 DIAGNOSIS — M6281 Muscle weakness (generalized): Secondary | ICD-10-CM | POA: Diagnosis not present

## 2021-11-24 DIAGNOSIS — M5441 Lumbago with sciatica, right side: Secondary | ICD-10-CM | POA: Diagnosis not present

## 2021-11-29 DIAGNOSIS — M5441 Lumbago with sciatica, right side: Secondary | ICD-10-CM | POA: Diagnosis not present

## 2021-11-29 DIAGNOSIS — R260 Ataxic gait: Secondary | ICD-10-CM | POA: Diagnosis not present

## 2021-11-29 DIAGNOSIS — M6281 Muscle weakness (generalized): Secondary | ICD-10-CM | POA: Diagnosis not present

## 2021-12-01 DIAGNOSIS — M6281 Muscle weakness (generalized): Secondary | ICD-10-CM | POA: Diagnosis not present

## 2021-12-01 DIAGNOSIS — M5441 Lumbago with sciatica, right side: Secondary | ICD-10-CM | POA: Diagnosis not present

## 2021-12-01 DIAGNOSIS — R260 Ataxic gait: Secondary | ICD-10-CM | POA: Diagnosis not present

## 2021-12-08 DIAGNOSIS — M5441 Lumbago with sciatica, right side: Secondary | ICD-10-CM | POA: Diagnosis not present

## 2021-12-08 DIAGNOSIS — M6281 Muscle weakness (generalized): Secondary | ICD-10-CM | POA: Diagnosis not present

## 2021-12-08 DIAGNOSIS — R260 Ataxic gait: Secondary | ICD-10-CM | POA: Diagnosis not present

## 2021-12-13 DIAGNOSIS — R260 Ataxic gait: Secondary | ICD-10-CM | POA: Diagnosis not present

## 2021-12-13 DIAGNOSIS — M6281 Muscle weakness (generalized): Secondary | ICD-10-CM | POA: Diagnosis not present

## 2021-12-13 DIAGNOSIS — M5441 Lumbago with sciatica, right side: Secondary | ICD-10-CM | POA: Diagnosis not present

## 2021-12-15 DIAGNOSIS — M6281 Muscle weakness (generalized): Secondary | ICD-10-CM | POA: Diagnosis not present

## 2021-12-15 DIAGNOSIS — M5441 Lumbago with sciatica, right side: Secondary | ICD-10-CM | POA: Diagnosis not present

## 2021-12-15 DIAGNOSIS — R260 Ataxic gait: Secondary | ICD-10-CM | POA: Diagnosis not present

## 2021-12-20 DIAGNOSIS — M6281 Muscle weakness (generalized): Secondary | ICD-10-CM | POA: Diagnosis not present

## 2021-12-20 DIAGNOSIS — M5441 Lumbago with sciatica, right side: Secondary | ICD-10-CM | POA: Diagnosis not present

## 2021-12-20 DIAGNOSIS — R260 Ataxic gait: Secondary | ICD-10-CM | POA: Diagnosis not present

## 2021-12-22 DIAGNOSIS — M5441 Lumbago with sciatica, right side: Secondary | ICD-10-CM | POA: Diagnosis not present

## 2021-12-22 DIAGNOSIS — M6281 Muscle weakness (generalized): Secondary | ICD-10-CM | POA: Diagnosis not present

## 2021-12-22 DIAGNOSIS — R260 Ataxic gait: Secondary | ICD-10-CM | POA: Diagnosis not present

## 2021-12-27 DIAGNOSIS — M6281 Muscle weakness (generalized): Secondary | ICD-10-CM | POA: Diagnosis not present

## 2021-12-27 DIAGNOSIS — M5441 Lumbago with sciatica, right side: Secondary | ICD-10-CM | POA: Diagnosis not present

## 2021-12-27 DIAGNOSIS — R260 Ataxic gait: Secondary | ICD-10-CM | POA: Diagnosis not present

## 2022-01-10 DIAGNOSIS — M6281 Muscle weakness (generalized): Secondary | ICD-10-CM | POA: Diagnosis not present

## 2022-01-10 DIAGNOSIS — M5441 Lumbago with sciatica, right side: Secondary | ICD-10-CM | POA: Diagnosis not present

## 2022-01-10 DIAGNOSIS — R260 Ataxic gait: Secondary | ICD-10-CM | POA: Diagnosis not present

## 2022-01-12 DIAGNOSIS — M5441 Lumbago with sciatica, right side: Secondary | ICD-10-CM | POA: Diagnosis not present

## 2022-01-12 DIAGNOSIS — R260 Ataxic gait: Secondary | ICD-10-CM | POA: Diagnosis not present

## 2022-01-12 DIAGNOSIS — M6281 Muscle weakness (generalized): Secondary | ICD-10-CM | POA: Diagnosis not present

## 2022-01-19 DIAGNOSIS — R260 Ataxic gait: Secondary | ICD-10-CM | POA: Diagnosis not present

## 2022-01-19 DIAGNOSIS — M5441 Lumbago with sciatica, right side: Secondary | ICD-10-CM | POA: Diagnosis not present

## 2022-01-19 DIAGNOSIS — M6281 Muscle weakness (generalized): Secondary | ICD-10-CM | POA: Diagnosis not present

## 2022-01-23 DIAGNOSIS — R260 Ataxic gait: Secondary | ICD-10-CM | POA: Diagnosis not present

## 2022-01-23 DIAGNOSIS — M5441 Lumbago with sciatica, right side: Secondary | ICD-10-CM | POA: Diagnosis not present

## 2022-01-23 DIAGNOSIS — M6281 Muscle weakness (generalized): Secondary | ICD-10-CM | POA: Diagnosis not present

## 2022-02-10 DIAGNOSIS — L218 Other seborrheic dermatitis: Secondary | ICD-10-CM | POA: Diagnosis not present

## 2022-02-10 DIAGNOSIS — L820 Inflamed seborrheic keratosis: Secondary | ICD-10-CM | POA: Diagnosis not present

## 2022-02-28 DIAGNOSIS — M5441 Lumbago with sciatica, right side: Secondary | ICD-10-CM | POA: Diagnosis not present

## 2022-02-28 DIAGNOSIS — R260 Ataxic gait: Secondary | ICD-10-CM | POA: Diagnosis not present

## 2022-02-28 DIAGNOSIS — M6281 Muscle weakness (generalized): Secondary | ICD-10-CM | POA: Diagnosis not present

## 2022-03-08 DIAGNOSIS — M5441 Lumbago with sciatica, right side: Secondary | ICD-10-CM | POA: Diagnosis not present

## 2022-03-08 DIAGNOSIS — R260 Ataxic gait: Secondary | ICD-10-CM | POA: Diagnosis not present

## 2022-03-08 DIAGNOSIS — M6281 Muscle weakness (generalized): Secondary | ICD-10-CM | POA: Diagnosis not present

## 2022-03-10 DIAGNOSIS — M5441 Lumbago with sciatica, right side: Secondary | ICD-10-CM | POA: Diagnosis not present

## 2022-03-10 DIAGNOSIS — R260 Ataxic gait: Secondary | ICD-10-CM | POA: Diagnosis not present

## 2022-03-10 DIAGNOSIS — M6281 Muscle weakness (generalized): Secondary | ICD-10-CM | POA: Diagnosis not present

## 2022-03-21 DIAGNOSIS — N183 Chronic kidney disease, stage 3 unspecified: Secondary | ICD-10-CM | POA: Diagnosis not present

## 2022-03-21 DIAGNOSIS — E7800 Pure hypercholesterolemia, unspecified: Secondary | ICD-10-CM | POA: Diagnosis not present

## 2022-03-21 DIAGNOSIS — I10 Essential (primary) hypertension: Secondary | ICD-10-CM | POA: Diagnosis not present

## 2022-03-21 DIAGNOSIS — Z23 Encounter for immunization: Secondary | ICD-10-CM | POA: Diagnosis not present

## 2022-03-21 DIAGNOSIS — M48062 Spinal stenosis, lumbar region with neurogenic claudication: Secondary | ICD-10-CM | POA: Diagnosis not present

## 2022-03-21 DIAGNOSIS — E1122 Type 2 diabetes mellitus with diabetic chronic kidney disease: Secondary | ICD-10-CM | POA: Diagnosis not present

## 2022-03-21 DIAGNOSIS — E1142 Type 2 diabetes mellitus with diabetic polyneuropathy: Secondary | ICD-10-CM | POA: Diagnosis not present

## 2022-03-21 DIAGNOSIS — I872 Venous insufficiency (chronic) (peripheral): Secondary | ICD-10-CM | POA: Diagnosis not present

## 2022-03-21 DIAGNOSIS — Z0000 Encounter for general adult medical examination without abnormal findings: Secondary | ICD-10-CM | POA: Diagnosis not present

## 2022-03-27 ENCOUNTER — Other Ambulatory Visit: Payer: Self-pay | Admitting: Family Medicine

## 2022-03-27 DIAGNOSIS — M48062 Spinal stenosis, lumbar region with neurogenic claudication: Secondary | ICD-10-CM

## 2022-04-20 DIAGNOSIS — M6281 Muscle weakness (generalized): Secondary | ICD-10-CM | POA: Diagnosis not present

## 2022-04-20 DIAGNOSIS — R260 Ataxic gait: Secondary | ICD-10-CM | POA: Diagnosis not present

## 2022-04-20 DIAGNOSIS — M5441 Lumbago with sciatica, right side: Secondary | ICD-10-CM | POA: Diagnosis not present

## 2022-04-24 ENCOUNTER — Ambulatory Visit
Admission: RE | Admit: 2022-04-24 | Discharge: 2022-04-24 | Disposition: A | Discharge status: Home / Self Care | Payer: Medicare Other | Source: Ambulatory Visit | Attending: Family Medicine | Admitting: Family Medicine

## 2022-04-24 DIAGNOSIS — M545 Low back pain, unspecified: Secondary | ICD-10-CM | POA: Diagnosis not present

## 2022-04-24 DIAGNOSIS — M4316 Spondylolisthesis, lumbar region: Secondary | ICD-10-CM | POA: Diagnosis not present

## 2022-04-24 DIAGNOSIS — M48062 Spinal stenosis, lumbar region with neurogenic claudication: Secondary | ICD-10-CM

## 2022-04-24 DIAGNOSIS — M48061 Spinal stenosis, lumbar region without neurogenic claudication: Secondary | ICD-10-CM | POA: Diagnosis not present

## 2022-04-25 DIAGNOSIS — M6281 Muscle weakness (generalized): Secondary | ICD-10-CM | POA: Diagnosis not present

## 2022-04-25 DIAGNOSIS — M5441 Lumbago with sciatica, right side: Secondary | ICD-10-CM | POA: Diagnosis not present

## 2022-04-25 DIAGNOSIS — R260 Ataxic gait: Secondary | ICD-10-CM | POA: Diagnosis not present

## 2022-05-08 DIAGNOSIS — R051 Acute cough: Secondary | ICD-10-CM | POA: Diagnosis not present

## 2022-05-08 DIAGNOSIS — R0981 Nasal congestion: Secondary | ICD-10-CM | POA: Diagnosis not present

## 2022-05-31 DIAGNOSIS — Z1231 Encounter for screening mammogram for malignant neoplasm of breast: Secondary | ICD-10-CM | POA: Diagnosis not present

## 2022-07-13 DIAGNOSIS — M4316 Spondylolisthesis, lumbar region: Secondary | ICD-10-CM | POA: Diagnosis not present

## 2022-07-26 DIAGNOSIS — M6281 Muscle weakness (generalized): Secondary | ICD-10-CM | POA: Diagnosis not present

## 2022-07-26 DIAGNOSIS — R260 Ataxic gait: Secondary | ICD-10-CM | POA: Diagnosis not present

## 2022-07-26 DIAGNOSIS — M48061 Spinal stenosis, lumbar region without neurogenic claudication: Secondary | ICD-10-CM | POA: Diagnosis not present

## 2022-07-31 DIAGNOSIS — M48061 Spinal stenosis, lumbar region without neurogenic claudication: Secondary | ICD-10-CM | POA: Diagnosis not present

## 2022-07-31 DIAGNOSIS — R260 Ataxic gait: Secondary | ICD-10-CM | POA: Diagnosis not present

## 2022-07-31 DIAGNOSIS — M6281 Muscle weakness (generalized): Secondary | ICD-10-CM | POA: Diagnosis not present

## 2022-08-03 DIAGNOSIS — R260 Ataxic gait: Secondary | ICD-10-CM | POA: Diagnosis not present

## 2022-08-03 DIAGNOSIS — M48061 Spinal stenosis, lumbar region without neurogenic claudication: Secondary | ICD-10-CM | POA: Diagnosis not present

## 2022-08-03 DIAGNOSIS — M6281 Muscle weakness (generalized): Secondary | ICD-10-CM | POA: Diagnosis not present

## 2022-08-07 DIAGNOSIS — M6281 Muscle weakness (generalized): Secondary | ICD-10-CM | POA: Diagnosis not present

## 2022-08-07 DIAGNOSIS — R260 Ataxic gait: Secondary | ICD-10-CM | POA: Diagnosis not present

## 2022-08-07 DIAGNOSIS — M48061 Spinal stenosis, lumbar region without neurogenic claudication: Secondary | ICD-10-CM | POA: Diagnosis not present

## 2022-08-16 DIAGNOSIS — M6281 Muscle weakness (generalized): Secondary | ICD-10-CM | POA: Diagnosis not present

## 2022-08-16 DIAGNOSIS — R260 Ataxic gait: Secondary | ICD-10-CM | POA: Diagnosis not present

## 2022-08-16 DIAGNOSIS — M48061 Spinal stenosis, lumbar region without neurogenic claudication: Secondary | ICD-10-CM | POA: Diagnosis not present

## 2022-08-21 DIAGNOSIS — M6281 Muscle weakness (generalized): Secondary | ICD-10-CM | POA: Diagnosis not present

## 2022-08-21 DIAGNOSIS — M48061 Spinal stenosis, lumbar region without neurogenic claudication: Secondary | ICD-10-CM | POA: Diagnosis not present

## 2022-08-21 DIAGNOSIS — R260 Ataxic gait: Secondary | ICD-10-CM | POA: Diagnosis not present

## 2022-08-28 DIAGNOSIS — M48061 Spinal stenosis, lumbar region without neurogenic claudication: Secondary | ICD-10-CM | POA: Diagnosis not present

## 2022-08-28 DIAGNOSIS — M6281 Muscle weakness (generalized): Secondary | ICD-10-CM | POA: Diagnosis not present

## 2022-08-28 DIAGNOSIS — R260 Ataxic gait: Secondary | ICD-10-CM | POA: Diagnosis not present

## 2022-08-30 DIAGNOSIS — M6281 Muscle weakness (generalized): Secondary | ICD-10-CM | POA: Diagnosis not present

## 2022-08-30 DIAGNOSIS — M48061 Spinal stenosis, lumbar region without neurogenic claudication: Secondary | ICD-10-CM | POA: Diagnosis not present

## 2022-08-30 DIAGNOSIS — R260 Ataxic gait: Secondary | ICD-10-CM | POA: Diagnosis not present

## 2022-09-07 DIAGNOSIS — M6281 Muscle weakness (generalized): Secondary | ICD-10-CM | POA: Diagnosis not present

## 2022-09-07 DIAGNOSIS — M48061 Spinal stenosis, lumbar region without neurogenic claudication: Secondary | ICD-10-CM | POA: Diagnosis not present

## 2022-09-07 DIAGNOSIS — R260 Ataxic gait: Secondary | ICD-10-CM | POA: Diagnosis not present

## 2022-09-12 DIAGNOSIS — M6281 Muscle weakness (generalized): Secondary | ICD-10-CM | POA: Diagnosis not present

## 2022-09-12 DIAGNOSIS — M48061 Spinal stenosis, lumbar region without neurogenic claudication: Secondary | ICD-10-CM | POA: Diagnosis not present

## 2022-09-12 DIAGNOSIS — R260 Ataxic gait: Secondary | ICD-10-CM | POA: Diagnosis not present

## 2022-09-14 DIAGNOSIS — Z961 Presence of intraocular lens: Secondary | ICD-10-CM | POA: Diagnosis not present

## 2022-09-14 DIAGNOSIS — H40013 Open angle with borderline findings, low risk, bilateral: Secondary | ICD-10-CM | POA: Diagnosis not present

## 2022-09-14 DIAGNOSIS — H04123 Dry eye syndrome of bilateral lacrimal glands: Secondary | ICD-10-CM | POA: Diagnosis not present

## 2022-09-14 DIAGNOSIS — H43813 Vitreous degeneration, bilateral: Secondary | ICD-10-CM | POA: Diagnosis not present

## 2022-09-14 DIAGNOSIS — H35363 Drusen (degenerative) of macula, bilateral: Secondary | ICD-10-CM | POA: Diagnosis not present

## 2022-09-18 ENCOUNTER — Ambulatory Visit: Payer: Medicare Other | Admitting: Cardiovascular Disease

## 2022-09-18 DIAGNOSIS — M6281 Muscle weakness (generalized): Secondary | ICD-10-CM | POA: Diagnosis not present

## 2022-09-18 DIAGNOSIS — M48061 Spinal stenosis, lumbar region without neurogenic claudication: Secondary | ICD-10-CM | POA: Diagnosis not present

## 2022-09-18 DIAGNOSIS — R260 Ataxic gait: Secondary | ICD-10-CM | POA: Diagnosis not present

## 2022-09-20 ENCOUNTER — Ambulatory Visit: Payer: Medicare Other | Attending: Cardiovascular Disease | Admitting: Cardiovascular Disease

## 2022-09-20 ENCOUNTER — Encounter: Payer: Self-pay | Admitting: Cardiovascular Disease

## 2022-09-20 VITALS — BP 164/85 | HR 69 | Ht 64.0 in | Wt 182.8 lb

## 2022-09-20 DIAGNOSIS — Z5181 Encounter for therapeutic drug level monitoring: Secondary | ICD-10-CM | POA: Diagnosis not present

## 2022-09-20 DIAGNOSIS — Z79899 Other long term (current) drug therapy: Secondary | ICD-10-CM

## 2022-09-20 DIAGNOSIS — I119 Hypertensive heart disease without heart failure: Secondary | ICD-10-CM | POA: Diagnosis not present

## 2022-09-20 DIAGNOSIS — I4510 Unspecified right bundle-branch block: Secondary | ICD-10-CM | POA: Diagnosis not present

## 2022-09-20 DIAGNOSIS — I872 Venous insufficiency (chronic) (peripheral): Secondary | ICD-10-CM | POA: Diagnosis not present

## 2022-09-20 MED ORDER — SPIRONOLACTONE 25 MG PO TABS: 25.0000 mg | ORAL_TABLET | Freq: Every day | ORAL | 3 refills | Status: DC

## 2022-09-20 NOTE — Patient Instructions (Addendum)
Medication Instructions:  Spironolactone 25 mg daily *If you need a refill on your cardiac medications before your next appointment, please call your pharmacy*  Lab Work: BMP- have this done a day or two before your follow up appt in a month If you have labs (blood work) drawn today and your tests are completely normal, you will receive your results only by: MyChart Message (if you have MyChart) OR A paper copy in the mail If you have any lab test that is abnormal or we need to change your treatment, we will call you to review the results.  Follow-Up: At [facility], you and your health needs are our priority.  As part of our continuing mission to provide you with exceptional heart care, we have created designated Provider Care Teams.  These Care Teams include your primary Cardiologist (physician) and Advanced Practice Providers (APPs -  Physician Assistants and Nurse Practitioners) who all work together to provide you with the care you need, when you need it.  We recommend signing up for the patient portal called "MyChart".  Sign up information is provided on this After Visit Summary.  MyChart is used to connect with patients for Virtual Visits (Telemedicine).  Patients are able to view lab/test results, encounter notes, upcoming appointments, etc.  Non-urgent messages can be sent to your provider as well.   To learn more about what you can do with MyChart, go to [email or web address].    Your next appointment:    1 month- APP  1 yr- Dr Royann Shivers

## 2022-09-20 NOTE — Progress Notes (Signed)
Cardiology Office Note    Date:  09/22/2022   ID:  Kaitlin [name], DOB 1934/05/09, MRN [id number]  PCP:  Noberto Retort, MD  Cardiologist:   Thurmon Fair, MD   Chief Complaint  Patient presents with   Hypertension    History of Present Illness:  Kaitlin [name] is a 88 y.o. female with severe HTN and hypertensive heart disease (LVH, Diastolic dysfunction by echo criteria, without clinical heart failure), peripheral venous insufficiency of the lower extremities, chronic right bundle branch block, obesity, borderline diabetes mellitus, presenting for routine follow-up.  She is generally feeling well, but her blood pressure is staying high.  Even after she relaxed for a few minutes in our office today her blood pressure was still high at 164/85.  She has mild ankle swelling towards the end of the day, usually goes away by the next morning, but not always.  She denies angina or dyspnea at rest or with activity.  She still enjoys water aerobics.  Has not had dizziness, palpitations or syncope.  Has not had any focal neurological events.  She takes clonidine just in the evening for assistance with sleep, she is on maximum dose amlodipine and telmisartan and takes a low-dose of torsemide.  Past Medical History:  Diagnosis Date   Arthritis    Borderline diabetes mellitus    HHD (hypertensive heart disease)    Hypertension    Mild diastolic dysfunction    Mild obesity    RBBB     Past Surgical History:  Procedure Laterality Date   ABDOMINAL HYSTERECTOMY  1986   CATARACT EXTRACTION W/ INTRAOCULAR LENS  IMPLANT, BILATERAL  2013   COLON SURGERY  2006   stress myoview  04/26/2007   No ischemia   US ECHOCARDIOGRAPHY  04/26/2007   mild MR & TR,LA mildly dilated,EF =>55%    Current Medications: Outpatient Medications Prior to Visit  Medication Sig Dispense Refill   Acetaminophen (TYLENOL 8 HOUR PO) Take 1 tablet by mouth 3 times/day as needed-between meals & bedtime.     amLODipine  (NORVASC) 5 MG tablet Take 1 tablet by mouth daily.     aspirin 81 MG tablet Take 81 mg by mouth daily.     cloNIDine (CATAPRES) 0.2 MG tablet Take 0.2 mg by mouth daily.     Multiple Vitamins-Minerals (CENTRUM SILVER WOMEN 50+ PO) Take 1 tablet by mouth daily in the afternoon.     telmisartan (MICARDIS) 80 MG tablet Take 80 mg by mouth daily.     torsemide (DEMADEX) 20 MG tablet Take 1 tablet by mouth Daily.     KLOR-CON M20 20 MEQ tablet Take 1 tablet by mouth Daily.     fluticasone (FLONASE) 50 MCG/ACT nasal spray Place 2 sprays into both nostrils 3 times/day as needed-between meals & bedtime.  (Patient not taking: Reported on 09/20/2022)     Multiple Vitamins-Iron (DAILY MULTIVITAMINS/IRON PO) 1 tablet (Patient not taking: Reported on 09/20/2022)     traMADol (ULTRAM) 50 MG tablet Take 50 mg by mouth daily.  (Patient not taking: Reported on 09/20/2022)     cloNIDine (CATAPRES) 0.1 MG tablet Take 1 tablet by mouth Daily. (Patient not taking: Reported on 09/20/2022)     No facility-administered medications prior to visit.     Allergies:   Amitriptyline hcl and Gabapentin   Social History   Socioeconomic History   Marital status: Married    Spouse name: Not on file   Number of children: Not  on file   Years of education: Not on file   Highest education level: Not on file  Occupational History   Not on file  Tobacco Use   Smoking status: Never   Smokeless tobacco: Never  Substance and Sexual Activity   Alcohol use: Yes    Alcohol/week: 1.0 standard drink of alcohol    Types: 1 Glasses of wine per week   Drug use: No   Sexual activity: Not on file  Other Topics Concern   Not on file  Social History Narrative   Not on file   Social Determinants of Health   Financial Resource Strain: Not on file  Food Insecurity: Not on file  Transportation Needs: Not on file  Physical Activity: Not on file  Stress: Not on file  Social Connections: Not on file     Family History:  The  patient's family history includes Cancer in her brother, maternal grandmother, sister, and sister; Colon cancer (age of onset: 68) in her sister; Diabetes in her sister; Heart attack in her brother and mother; Heart failure in her maternal grandfather and paternal grandmother; Hypertension in her brother and sister; Kidney disease in her paternal grandfather; Stroke in her father.   ROS:   Please see the history of present illness.    ROS All other systems reviewed and are negative.   PHYSICAL EXAM:   VS:  BP (!) 164/85   Pulse 69   Ht 5\' 4"  (1.626 m)   Wt 182 lb 12.8 oz (82.9 kg)   SpO2 99%   BMI 31.38 kg/m      General: Alert, oriented x3, no distress, mildly obese Head: no evidence of trauma, PERRL, EOMI, no exophtalmos or lid lag, no myxedema, no xanthelasma; normal ears, nose and oropharynx Neck: normal jugular venous pulsations and no hepatojugular reflux; brisk carotid pulses without delay and no carotid bruits Chest: clear to auscultation, no signs of consolidation by percussion or palpation, normal fremitus, symmetrical and full respiratory excursions Cardiovascular: normal position and quality of the apical impulse, regular rhythm, normal first and widely split second heart sounds, no murmurs, rubs or gallops Abdomen: no tenderness or distention, no masses by palpation, no abnormal pulsatility or arterial bruits, normal bowel sounds, no hepatosplenomegaly Extremities: Symmetrical 1+ bilateral ankle edema Neurological: grossly nonfocal Psych: Normal mood and affect  Wt Readings from Last 3 Encounters:  09/20/22 182 lb 12.8 oz (82.9 kg)  06/09/21 181 lb 6.4 oz (82.3 kg)  04/30/20 181 lb (82.1 kg)      Studies/Labs Reviewed:   EKG:  EKG is ordered today.  Unchanged from previous tracings, shows normal sinus rhythm with right bundle branch block Recent Labs: 07/13/2016 Hemoglobin A1c 6.1%, normal LFTs, creatinine 1.07, potassium 4.1, TSH 1.06 Cholesterol 189, HDL 82, LDL  95, triglycerides 58  03/02/2020 HDL 93, LDL 103, total cholesterol 206, triglycerides 54 Hemoglobin A1c 6.1%, fasting glucose 128, creatinine 1.2, BUN 21, TSH 1.38  03/08/2021 Cholesterol 205, HDL 101, LDL 94, triglycerides 55 Hemoglobin A1c 6.3%, hemoglobin 12.5 05/16/2021 Creatinine 1.09, potassium 4.1, ALT 17, TSH 0.53  ASSESSMENT:    1. Hypertensive heart disease without heart failure   2. Encounter for monitoring diuretic therapy   3. Medication management   4. Peripheral venous insufficiency   5. RBBB       PLAN:  In order of problems listed above:  HTN: Not well-controlled.  Target BP 140/90, since she has had issues with orthostatic hypotension in the past.  On doses of  amlodipine and telmisartan and do not want to prescribe the clonidine during daytime since it causes drowsiness.  Heart rate is relatively slow for beta-blockers.  That is probably at 25 mg daily, stop potassium supplement, return for a basic metabolic panel and repeat blood pressure check in about a month. LVH/ diastolic dysfunction: NYHA functional class I.  Other than mild ankle swelling, which is most likely due to the calcium channel blocker and venous insufficiency, she has no signs of hypervolemia.  She does not have symptoms of left heart failure. Venous Insufficiency: Mild peripheral edema, no stasis ulcers. RBBB: Longstanding.  No symptoms to suggest periods of high-grade AV block.    Medication Adjustments/Labs and Tests Ordered: Current medicines are reviewed at length with the patient today.  Concerns regarding medicines are outlined above.  Medication changes, Labs and Tests ordered today are listed in the Patient Instructions below. Patient Instructions  Medication Instructions:  Spironolactone 25 mg daily *If you need a refill on your cardiac medications before your next appointment, please call your pharmacy*  Lab Work: BMP- have this done a day or two before your follow up appt in a  month If you have labs (blood work) drawn today and your tests are completely normal, you will receive your results only by: MyChart Message (if you have MyChart) OR A paper copy in the mail If you have any lab test that is abnormal or we need to change your treatment, we will call you to review the results.  Follow-Up: At [facility], you and your health needs are our priority.  As part of our continuing mission to provide you with exceptional heart care, we have created designated Provider Care Teams.  These Care Teams include your primary Cardiologist (physician) and Advanced Practice Providers (APPs -  Physician Assistants and Nurse Practitioners) who all work together to provide you with the care you need, when you need it.  We recommend signing up for the patient portal called "MyChart".  Sign up information is provided on this After Visit Summary.  MyChart is used to connect with patients for Virtual Visits (Telemedicine).  Patients are able to view lab/test results, encounter notes, upcoming appointments, etc.  Non-urgent messages can be sent to your provider as well.   To learn more about what you can do with MyChart, go to [email or web address].    Your next appointment:    1 month- APP  1 yr- Dr Royann Shivers      Signed, Thurmon Fair, MD  09/22/2022 11:53 AM    [facility] [address] [place], [place], [place]  [address] Phone: [phone]; Fax: [phone]

## 2022-09-21 DIAGNOSIS — N183 Chronic kidney disease, stage 3 unspecified: Secondary | ICD-10-CM | POA: Diagnosis not present

## 2022-09-21 DIAGNOSIS — R260 Ataxic gait: Secondary | ICD-10-CM | POA: Diagnosis not present

## 2022-09-21 DIAGNOSIS — E1122 Type 2 diabetes mellitus with diabetic chronic kidney disease: Secondary | ICD-10-CM | POA: Diagnosis not present

## 2022-09-21 DIAGNOSIS — I10 Essential (primary) hypertension: Secondary | ICD-10-CM | POA: Diagnosis not present

## 2022-09-21 DIAGNOSIS — E1142 Type 2 diabetes mellitus with diabetic polyneuropathy: Secondary | ICD-10-CM | POA: Diagnosis not present

## 2022-09-21 DIAGNOSIS — M6281 Muscle weakness (generalized): Secondary | ICD-10-CM | POA: Diagnosis not present

## 2022-09-21 DIAGNOSIS — M48062 Spinal stenosis, lumbar region with neurogenic claudication: Secondary | ICD-10-CM | POA: Diagnosis not present

## 2022-09-21 DIAGNOSIS — I872 Venous insufficiency (chronic) (peripheral): Secondary | ICD-10-CM | POA: Diagnosis not present

## 2022-09-21 DIAGNOSIS — M48061 Spinal stenosis, lumbar region without neurogenic claudication: Secondary | ICD-10-CM | POA: Diagnosis not present

## 2022-09-22 ENCOUNTER — Encounter: Payer: Self-pay | Admitting: Cardiovascular Disease

## 2022-09-26 ENCOUNTER — Other Ambulatory Visit: Payer: Self-pay

## 2022-09-26 ENCOUNTER — Emergency Department (HOSPITAL_BASED_OUTPATIENT_CLINIC_OR_DEPARTMENT_OTHER): Payer: Medicare Other

## 2022-09-26 ENCOUNTER — Emergency Department (HOSPITAL_BASED_OUTPATIENT_CLINIC_OR_DEPARTMENT_OTHER)
Admission: EM | Admit: 2022-09-26 | Discharge: 2022-09-26 | Disposition: A | Discharge status: Home / Self Care | Payer: Medicare Other

## 2022-09-26 ENCOUNTER — Encounter (HOSPITAL_BASED_OUTPATIENT_CLINIC_OR_DEPARTMENT_OTHER): Payer: Self-pay | Admitting: Urology

## 2022-09-26 DIAGNOSIS — Z79899 Other long term (current) drug therapy: Secondary | ICD-10-CM | POA: Diagnosis not present

## 2022-09-26 DIAGNOSIS — Z7982 Long term (current) use of aspirin: Secondary | ICD-10-CM | POA: Diagnosis not present

## 2022-09-26 DIAGNOSIS — M79605 Pain in left leg: Secondary | ICD-10-CM | POA: Diagnosis not present

## 2022-09-26 DIAGNOSIS — M25572 Pain in left ankle and joints of left foot: Secondary | ICD-10-CM | POA: Diagnosis not present

## 2022-09-26 DIAGNOSIS — I2510 Atherosclerotic heart disease of native coronary artery without angina pectoris: Secondary | ICD-10-CM | POA: Diagnosis not present

## 2022-09-26 DIAGNOSIS — S93432A Sprain of tibiofibular ligament of left ankle, initial encounter: Secondary | ICD-10-CM | POA: Diagnosis not present

## 2022-09-26 DIAGNOSIS — X58XXXA Exposure to other specified factors, initial encounter: Secondary | ICD-10-CM | POA: Diagnosis not present

## 2022-09-26 DIAGNOSIS — R600 Localized edema: Secondary | ICD-10-CM | POA: Diagnosis not present

## 2022-09-26 DIAGNOSIS — M7989 Other specified soft tissue disorders: Secondary | ICD-10-CM | POA: Diagnosis not present

## 2022-09-26 DIAGNOSIS — I10 Essential (primary) hypertension: Secondary | ICD-10-CM | POA: Insufficient documentation

## 2022-09-26 DIAGNOSIS — S93492A Sprain of other ligament of left ankle, initial encounter: Secondary | ICD-10-CM

## 2022-09-26 LAB — CBC WITH DIFFERENTIAL/PLATELET
Abs Immature Granulocytes: 0.02 10*3/uL (ref 0.00–0.07)
Basophils Absolute: 0 10*3/uL (ref 0.0–0.1)
Basophils Relative: 1 %
Eosinophils Absolute: 0.2 10*3/uL (ref 0.0–0.5)
Eosinophils Relative: 3 %
HCT: 36.5 % (ref 36.0–46.0)
Hemoglobin: 11.9 g/dL — ABNORMAL LOW (ref 12.0–15.0)
Immature Granulocytes: 0 %
Lymphocytes Relative: 29 %
Lymphs Abs: 1.9 10*3/uL (ref 0.7–4.0)
MCH: 30.1 pg (ref 26.0–34.0)
MCHC: 32.6 g/dL (ref 30.0–36.0)
MCV: 92.2 fL (ref 80.0–100.0)
Monocytes Absolute: 0.7 10*3/uL (ref 0.1–1.0)
Monocytes Relative: 10 %
Neutro Abs: 3.9 10*3/uL (ref 1.7–7.7)
Neutrophils Relative %: 57 %
Platelets: 237 10*3/uL (ref 150–400)
RBC: 3.96 MIL/uL (ref 3.87–5.11)
RDW: 12.8 % (ref 11.5–15.5)
WBC: 6.8 10*3/uL (ref 4.0–10.5)
nRBC: 0 % (ref 0.0–0.2)

## 2022-09-26 LAB — COMPREHENSIVE METABOLIC PANEL
ALT: 29 U/L (ref 0–44)
AST: 25 U/L (ref 15–41)
Albumin: 3.9 g/dL (ref 3.5–5.0)
Alkaline Phosphatase: 84 U/L (ref 38–126)
Anion gap: 9 (ref 5–15)
BUN: 16 mg/dL (ref 8–23)
CO2: 26 mmol/L (ref 22–32)
Calcium: 9.6 mg/dL (ref 8.9–10.3)
Chloride: 107 mmol/L (ref 98–111)
Creatinine, Ser: 0.94 mg/dL (ref 0.44–1.00)
GFR, Estimated: 59 mL/min — ABNORMAL LOW (ref >60)
Glucose, Bld: 98 mg/dL (ref 70–99)
Potassium: 4.6 mmol/L (ref 3.5–5.1)
Sodium: 142 mmol/L (ref 135–145)
Total Bilirubin: 0.6 mg/dL (ref 0.3–1.2)
Total Protein: 7.7 g/dL (ref 6.5–8.1)

## 2022-09-26 NOTE — Discharge Instructions (Signed)
You have been seen here today for your left leg swelling.  You did not have a blood clot (DVT) in your leg.  Your leg swelling is likely due to venous insufficiency.  Please elevate your legs above the level of your heart when sitting.  Please wear compression stockings to help with your swelling.  Your ankle pain may be due to a ankle sprain.  Please wear the lace up ankle brace provided as needed for support and to help with your pain.  You may wean out of the brace as tolerated.  You may take up to 1000mg  of tylenol every 6 hours as needed for pain.   Follow-up with your PT regarding your ankle pain.  They will be able to work with you on therapeutic exercises.  Follow-up with your PCP within the next 2 weeks to follow-up on your lower extremity swelling and to monitor your symptoms.  Please return to the ER if you develop calf pain, shortness of breath, chest pain, dizziness, any other new or concerning symptoms.

## 2022-09-26 NOTE — ED Triage Notes (Signed)
Pt states left leg pain, swelling and discoloration since Sunday Gradually worsening, pain with walking  States skin is dry on that leg as well  Sent from UC for R/O DVT

## 2022-09-26 NOTE — ED Provider Notes (Signed)
[facility] Provider Note   CSN: [id number] Arrival date & time: 09/26/22  1333     History  Chief Complaint  Patient presents with   Leg Swelling    Kaitlin [name] is a 88 y.o. female with history of hypertension, coronary artery disease, presents with concern for left leg swelling and pain.  She started noticing skin changes of her left leg about a month ago and was seen by her primary care who thought this was due to venous insufficiency.  3 days ago she started noticing swelling in her left calf and left foot.  Yesterday she started to notice associated pain in the left ankle.  Denies any injuries to her left ankle.  No pain in her left calf. She started wearing compression stockings but this has not seemed to help  She denies any recent long plane or car rides, recent surgeries, periods of immobilization, hormonal therapies.  Denies any shortness of breath or chest pain.  HPI     Home Medications Prior to Admission medications   Medication Sig Start Date End Date Taking? Authorizing Provider  Acetaminophen (TYLENOL 8 HOUR PO) Take 1 tablet by mouth 3 times/day as needed-between meals & bedtime.    [provider]  amLODipine (NORVASC) 5 MG tablet Take 1 tablet by mouth daily.    [provider]  aspirin 81 MG tablet Take 81 mg by mouth daily.    [provider]  cloNIDine (CATAPRES) 0.2 MG tablet Take 0.2 mg by mouth daily. 07/20/22   [provider]  fluticasone (FLONASE) 50 MCG/ACT nasal spray Place 2 sprays into both nostrils 3 times/day as needed-between meals & bedtime.  Patient not taking: Reported on 09/20/2022    [provider]  Multiple Vitamins-Minerals (CENTRUM SILVER WOMEN 50+ PO) Take 1 tablet by mouth daily in the afternoon.    [provider]  spironolactone (ALDACTONE) 25 MG tablet Take 1 tablet (25 mg total) by mouth daily. 09/20/22   Croitoru, Mihai, MD  telmisartan  (MICARDIS) 80 MG tablet Take 80 mg by mouth daily. 04/28/20   [provider]  torsemide (DEMADEX) 20 MG tablet Take 1 tablet by mouth Daily. 06/17/11   [provider]  traMADol (ULTRAM) 50 MG tablet Take 50 mg by mouth daily.  Patient not taking: Reported on 09/20/2022 09/08/13   [provider]      Allergies    Amitriptyline hcl and Gabapentin    Review of Systems   Review of Systems  Respiratory:  Negative for shortness of breath.   Cardiovascular:  Positive for leg swelling.    Physical Exam Updated Vital Signs BP (!) 177/99 (BP Location: Right Arm)   Pulse 65   Temp 98 F (36.7 C) (Oral)   Resp 18   Ht 5\' 4"  (1.626 m)   Wt 82.9 kg   SpO2 100%   BMI 31.37 kg/m  Physical Exam Vitals and nursing note reviewed.  Constitutional:      General: She is not in acute distress.    Appearance: She is well-developed.     Comments: Talking in full sentences, breathing comfortably on room air  HENT:     Head: Normocephalic and atraumatic.  Eyes:     Conjunctiva/sclera: Conjunctivae normal.  Cardiovascular:     Rate and Rhythm: Normal rate and regular rhythm.     Heart sounds: No murmur heard. Pulmonary:     Effort: Pulmonary effort is normal. No  respiratory distress.     Breath sounds: Normal breath sounds.  Abdominal:     Palpations: Abdomen is soft.     Tenderness: There is no abdominal tenderness.  Musculoskeletal:        General: Swelling present.     Cervical back: Neck supple.     Comments: 2+ edema of the left foot, ankle, calf.  Left calf is firm feeling, no tenderness to palpation.  Mildly tender to palpation of the left lateral ankle along the ATFL. No pain upon palpation of the CFL, PTFL, peroneal tendon.  Full range of motion of the left ankle.  5 out of 5 strength of the left ankle with plantarflexion, dorsiflexion, inversion, eversion.  Slight pain with resisted inversion of the left ankle  2+ edema of the right ankle, no edema of the  right foot or calf.  No tenderness to palpation of the right calf    Skin:    General: Skin is warm and dry.     Capillary Refill: Capillary refill takes less than 2 seconds.     Comments: Skin dry and flaky of the left lower extremity, some dryness of the skin of the right lower extremity.  No wounds or ulcerations of the lower extremities bilaterally  Neurological:     Mental Status: She is alert.  Psychiatric:        Mood and Affect: Mood normal.     ED Results / Procedures / Treatments   Labs (all labs ordered are listed, but only abnormal results are displayed) Labs Reviewed  CBC WITH DIFFERENTIAL/PLATELET - Abnormal; Notable for the following components:      Result Value   Hemoglobin 11.9 (*)    All other components within normal limits  COMPREHENSIVE METABOLIC PANEL - Abnormal; Notable for the following components:   GFR, Estimated 59 (*)    All other components within normal limits    EKG None  Radiology US Venous Img Lower Unilateral Left  Result Date: 09/26/2022 CLINICAL DATA:  Left lower extremity edema for the past 2 days. Evaluate for DVT. EXAM: LEFT LOWER EXTREMITY VENOUS DOPPLER ULTRASOUND TECHNIQUE: Gray-scale sonography with graded compression, as well as color Doppler and duplex ultrasound were performed to evaluate the lower extremity deep venous systems from the level of the common femoral vein and including the common femoral, femoral, profunda femoral, popliteal and calf veins including the posterior tibial, peroneal and gastrocnemius veins when visible. The superficial great saphenous vein was also interrogated. Spectral Doppler was utilized to evaluate flow at rest and with distal augmentation maneuvers in the common femoral, femoral and popliteal veins. COMPARISON:  None Available. FINDINGS: Contralateral Common Femoral Vein: Respiratory phasicity is normal and symmetric with the symptomatic side. No evidence of thrombus. Normal compressibility. Common  Femoral Vein: No evidence of thrombus. Normal compressibility, respiratory phasicity and response to augmentation. Saphenofemoral Junction: No evidence of thrombus. Normal compressibility and flow on color Doppler imaging. Profunda Femoral Vein: No evidence of thrombus. Normal compressibility and flow on color Doppler imaging. Femoral Vein: No evidence of thrombus. Normal compressibility, respiratory phasicity and response to augmentation. Popliteal Vein: No evidence of thrombus. Normal compressibility, respiratory phasicity and response to augmentation. Calf Veins: No evidence of thrombus. Normal compressibility and flow on color Doppler imaging. Superficial Great Saphenous Vein: No evidence of thrombus. Normal compressibility. Other Findings:  None. IMPRESSION: No evidence of DVT within the left lower extremity. Electronically Signed   By: Simonne Come M.D.   On: 09/26/2022 15:01  Procedures Procedures    Medications Ordered in ED Medications - No data to display  ED Course/ Medical Decision Making/ A&P Clinical Course as of 09/26/22 1637  Tue Sep 26, 2022  1508 US Venous Img Lower Unilateral Left [AF]    Clinical Course User Index [AF] Arabella Merles, PA-C                                 Medical Decision Making Amount and/or Complexity of Data Reviewed Labs: ordered. Radiology: ordered. Decision-making details documented in ED Course.   88 y.o. female with pertinent past medical history of hypertension, CAD presents to the ED for concern of left leg swelling for 3 days and left ankle pain for 1 day  Differential diagnosis includes but is not limited to DVT, PE, venous insufficiency, lymphedema, left ankle sprain, peroneal tendinitis  ED Course:  Patient presents with concern for left lower extremity swelling for 3 days and pain around her ankle for the last day.  She denies any risk factors for DVT.  However, on exam she does have more swelling of the left foot, ankle and calf  compared to the right leg.  Will get a lower extremity ultrasound to rule out DVT.  She is not having any shortness of breath, chest pain, she is staying at 100% on room air, no tachycardia, low suspicion for PE at this time. She also notes pain around the lateral aspect of her left ankle.  She does have pain to palpation in this area and pain with resisted ankle inversion.  She denies any injuries to the area and has been able to ambulate on the foot without difficulty.  However, will obtain left ankle x-rays to out any fracture or dislocation in this area. Ultrasound of the left lower extremity negative for DVT.  X-ray of left ankle negative for any acute bony abnormalities or dislocations.  I talked to the patient regarding her results and she understands that her swelling is likely due to venous insufficiency.  I discussed compression stockings and elevating her feet when at rest above the level of her heart.  I discussed with her that her ankle pain is along the ATFL and she may have a slight ankle sprain.  She states she goes to PT twice a week.  I encouraged her to bring her ankle pain up to her physical therapist that they can work on exercises with her to help with her ankle pain.  She was placed in a lace up ankle brace for support.  She states she already uses Tylenol for arthritis pain, she may continue to use this to help with her ankle pain.  Impression: Left lower leg swelling, likely due to venous insufficiency Left ankle sprain  Disposition:  The patient was discharged home with instructions to wear compression stockings and elevate her feet when at rest to help with her swelling.  Follow-up with her PCP within the next 2 weeks for recheck of symptoms.  Follow-up with her PT regarding her ankle pain for further evaluation and therapeutic exercises.  Wear lace up ankle brace provided as needed for support for left ankle. Return precautions given.  Lab Tests: I Ordered, and personally  interpreted labs.  The pertinent results include:   CMP and BNP unremarkable  Imaging Studies ordered: I ordered imaging studies including ultrasound left lower extremity, x-ray left ankle I independently visualized the imaging with scope of interpretation limited  to determining acute life threatening conditions related to emergency care. Imaging showed  Left lower extremity ultrasound with no signs of DVT X-ray left ankle with no acute dislocations or fractures I agree with the radiologist interpretation   Co morbidities that complicate the patient evaluation  Hypertension, CAD              Final Clinical Impression(s) / ED Diagnoses Final diagnoses:  Edema of left lower leg  Sprain of anterior talofibular ligament of left ankle, initial encounter    Rx / DC Orders ED Discharge Orders     None         Arabella Merles, PA-C 09/26/22 1637    Coral Spikes, DO 09/27/22 [phone]

## 2022-10-03 ENCOUNTER — Telehealth: Payer: Self-pay | Admitting: Cardiovascular Disease

## 2022-10-03 NOTE — Telephone Encounter (Signed)
*  STAT* If patient is at the pharmacy, call can be transferred to refill team.   1. Which medications need to be refilled? (please list name of each medication and dose if known)   spironolactone (ALDACTONE) 25 MG tablet    2. Which pharmacy/location (including street and city if local pharmacy) is medication to be sent to?[facility] - [place], [place] - [address]   3. Do they need a 30 day or 90 day supply? 90 day

## 2022-10-04 NOTE — Telephone Encounter (Signed)
Prescription for Spironolactone was sent to [facility] at day of office visit.

## 2022-10-17 ENCOUNTER — Telehealth: Payer: Self-pay | Admitting: Cardiovascular Disease

## 2022-10-17 NOTE — Telephone Encounter (Signed)
Call to patient to verify information

## 2022-10-17 NOTE — Telephone Encounter (Signed)
Pt c/o medication issue:  1. Name of Medication:   spironolactone (ALDACTONE) 25 MG tablet    2. How are you currently taking this medication (dosage and times per day)?   3. Are you having a reaction (difficulty breathing--STAT)?   4. What is your medication issue? Patient states that medication was sent to the wrong pharmacy and needs it changed.   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

## 2022-10-18 MED ORDER — SPIRONOLACTONE 25 MG PO TABS: 25.0000 mg | ORAL_TABLET | Freq: Every day | ORAL | 3 refills | Status: DC

## 2022-10-18 NOTE — Telephone Encounter (Signed)
Spoke to patient and it was sent to the wrong [facility]. It was sent to "[facility]" not [facility] as normal .  Sent to the correct [facility]. She is aware

## 2022-11-01 ENCOUNTER — Ambulatory Visit: Payer: Medicare Other | Admitting: Physician Assistant

## 2022-11-01 DIAGNOSIS — I872 Venous insufficiency (chronic) (peripheral): Secondary | ICD-10-CM | POA: Diagnosis not present

## 2022-11-09 NOTE — Progress Notes (Deleted)
Cardiology Office Note:  .   Date:  11/09/2022  ID:  Kaitlin [name], DOB October 01, 1934, MRN [id number] PCP: Noberto Retort, MD  [facility] Providers Cardiologist:  Thurmon Fair, MD }   History of Present Illness: [name]   Kaitlin [name] is a 88 y.o. female with a past medical history of hypertension, hypertensive heart disease (LVH, diastolic dysfunction without clinical heart failure), peripheral venous insufficiency, chronic RBBB, obesity, borderline diabetes.  Patient is followed by Dr. Royann Shivers and presents today for a follow up for high blood pressure   Previously underwent echocardiogram in 2009 that showed normal LV function, impaired LV relaxation. Underwent nuclear stress test in 2009 that showed no evidence of inducible myocardial ischemia  Patient was last by Dr. Royann Shivers on 09/20/2022.  At that time, patient reported that though she was feeling well, her blood pressure had been high.  In office, her BP was elevated to 164/85.  Patient also reported having mild ankle swelling towards the end of the day that usually goes away by the next morning.  Patient remained on amlodipine, telmisartan, nighttime clonidine.  She was started on spironolactone 25 mg daily.  HTN  HTN heart disease  LVH/diastolic dysfunction  - NYHA functional class I. Other than mild ankle swelling, no signs or symptoms of hypervolemia  - Currently on amlodipine 5 mg daily, telmisartan 80 mg daily, clonidine 0.2 mg every PM, spironolactone 25 mg daily  - BP ***  Peripheral venous insufficiency    RBBB - Chronic - No symptoms to suggest periods of high-grade AV block   ROS: ***  Studies Reviewed: .        *** Risk Assessment/Calculations:   {Does this patient have ATRIAL FIBRILLATION?[phone]} No BP recorded.  {Refresh Note OR Click here to enter BP  :1}***       Physical Exam:   VS:  There were no vitals taken for this visit.   Wt Readings from Last 3 Encounters:  09/26/22 182 lb 12.2 oz (82.9  kg)  09/20/22 182 lb 12.8 oz (82.9 kg)  06/09/21 181 lb 6.4 oz (82.3 kg)    GEN: Well nourished, well developed in no acute distress NECK: No JVD; No carotid bruits CARDIAC: ***RRR, no murmurs, rubs, gallops RESPIRATORY:  Clear to auscultation without rales, wheezing or rhonchi  ABDOMEN: Soft, non-tender, non-distended EXTREMITIES:  No edema; No deformity   ASSESSMENT AND PLAN: .   ***    {Are you ordering a CV Procedure (e.g. stress test, cath, DCCV, TEE, etc)?   Press F2        :[id number]}  Dispo: ***  Signed, Jonita Albee, PA-C

## 2022-11-21 ENCOUNTER — Telehealth: Payer: Self-pay

## 2022-11-21 ENCOUNTER — Ambulatory Visit: Payer: Medicare Other | Attending: Physician Assistant | Admitting: Cardiology

## 2022-11-21 NOTE — Telephone Encounter (Signed)
Called patient to reschedule appointment Left message to call our office back.

## 2022-11-24 ENCOUNTER — Other Ambulatory Visit (HOSPITAL_COMMUNITY): Payer: Self-pay | Admitting: Family Medicine

## 2022-11-24 ENCOUNTER — Ambulatory Visit (HOSPITAL_COMMUNITY)
Admission: RE | Admit: 2022-11-24 | Discharge: 2022-11-24 | Disposition: A | Discharge status: Home / Self Care | Payer: Medicare Other | Source: Ambulatory Visit | Attending: Internal Medicine | Admitting: Internal Medicine

## 2022-11-24 DIAGNOSIS — M7989 Other specified soft tissue disorders: Secondary | ICD-10-CM | POA: Diagnosis not present

## 2022-11-24 DIAGNOSIS — M79605 Pain in left leg: Secondary | ICD-10-CM | POA: Diagnosis not present

## 2022-11-24 DIAGNOSIS — R600 Localized edema: Secondary | ICD-10-CM | POA: Diagnosis not present

## 2022-11-24 DIAGNOSIS — I10 Essential (primary) hypertension: Secondary | ICD-10-CM | POA: Diagnosis not present

## 2022-11-24 NOTE — Progress Notes (Signed)
Cardiology Office Note:  .   Date:  12/05/2022  ID:  Kaitlin [name], DOB March 07, 1934, MRN [id number] PCP: Noberto Retort, MD  [facility] Providers Cardiologist:  Thurmon Fair, MD }   History of Present Illness: [name]   Kaitlin [name] is a 88 y.o. female with a past medical history of hypertension, hypertensive heart disease (LVH, diastolic dysfunction without clinical heart failure), peripheral venous insufficiency, chronic RBBB, obesity, borderline diabetes.  Patient is followed by Dr. Royann Shivers and presents today for a follow up for high blood pressure   Previously underwent echocardiogram in 2009 that showed normal LV function, impaired LV relaxation. Underwent nuclear stress test in 2009 that showed no evidence of inducible myocardial ischemia  Patient was last by Dr. Royann Shivers on 09/20/2022.  At that time, patient reported that though she was feeling well, her blood pressure had been high.  In office, her BP was elevated to 164/85.  Patient also reported having mild ankle swelling towards the end of the day that usually goes away by the next morning.  Patient remained on amlodipine, telmisartan, night-time clonidine.  She was started on spironolactone 25 mg daily for BP management and lower extremity edema.   Today, patient presents for follow up on BP and lower extremity edema. She has stopped taking spironolactone due to generalized itchiness. Symptoms resolved when spironolactone was stopped. Denied any other changes to medications or changes in lifestyle that could have caused itchiness. When she was taking spironolactone, she did not notice any improvement in leg swelling. Swelling has been worse in the left leg. She has also noticed dry skin in her lower legs where she is swollen.   The patient has been managing the leg swelling with compression socks and leg elevation, both of which have been reported to provide some relief. The patient also reported adherence to a low-salt diet, cooking  meals at home, and consuming fruits and vegetables daily.  The patient has been taking a half tablet of torsemide (10 mg total) and potassium supplements daily. She also takes clonidine at night due to its sedative effects. The patient has undergone multiple scans for the leg swelling, all of which have shown no evidence of blood clots. The patient has a known history of venous insufficiency, which may be contributing to the persistent leg swelling.  The patient's blood pressure has been fluctuating, with readings ranging from the 140s to the 160s for the systolic value. The patient has been maintaining a blood pressure log at home. She reported a history of dizziness upon standing, but this has not been an issue recently.  ROS: Denies shortness of breath, orthopnea, chest pain, dizziness, syncope, near syncope   Studies Reviewed: [name]          VAS ultrasound left DVT 11/24/22  RIGHT:  - No evidence of common femoral vein obstruction.    LEFT:  - No evidence of deep vein thrombosis in the lower extremity. No indirect  evidence of obstruction proximal to the inguinal ligament.  - No cystic structure found in the popliteal fossa.  - Multiple enlarged lymph nodes noted in the proximal thigh, with one  measuring 2.6 cm in length x 2.4 cm in width, and the second one measuring  2.6 cm in length x 1.8 cm in width.    Risk Assessment/Calculations:     HYPERTENSION CONTROL Vitals:   12/05/22 0846 12/05/22 1000  BP: (!) 142/84 (!) 148/82    The patient's blood pressure is elevated  above target today.  In order to address the patient's elevated BP: A new medication was prescribed today.          Physical Exam:   VS:  BP (!) 148/82   Pulse 64   Ht 5\' 4"  (1.626 m)   Wt 187 lb 12.8 oz (85.2 kg)   SpO2 100%   BMI 32.24 kg/m    Wt Readings from Last 3 Encounters:  12/05/22 187 lb 12.8 oz (85.2 kg)  09/26/22 182 lb 12.2 oz (82.9 kg)  09/20/22 182 lb 12.8 oz (82.9 kg)    GEN: Well  nourished, well developed in no acute distress. Sitting comfortably in the chair  NECK: No JVD CARDIAC:RRR, no murmurs, rubs, gallops. Radial pulses 2+ bilaterally  RESPIRATORY:  Clear to auscultation without rales, wheezing or rhonchi. Normal work of breathing on room air  ABDOMEN: Soft, non-tender, non-distended EXTREMITIES:  2+ edema in LLE, 1+ edema in RLE; No deformity   ASSESSMENT AND PLAN: .    HTN  - Goal BP 140/90. In the past, patient had issues with orthostatic hypotension but denies any recent dizziness, syncope, near syncope  - BP elevated today in clinic to 142/84, 148/82. She has a BP log at home, but forgot to bring it in today. Reports that her SBP is often elevated to the 150s-160s at home, never below 140s  - Currently on amlodipine 10 mg daily, telmisartan 80 mg daily. Takes clonidine 0.2 mg before bed, cannot take during the day as it causes fatigue.  - Patient had generalized itchiness on spironolactone- denies any other identifiable causes of itchiness. Symptoms resolved when spironolactone was discontinued. Added spironolactone to allergy list  - HR a bit low - not ideal candidate for BB  - Start hydralazine 25 mg TID. Instructed patient to continue BP log and to make sure she brings to follow up appointment  - Ordered BMP today  - Note, patient did not notice worsening ankle edema when her amlodipine dose was increased by PCP about 1 year ago   HTN heart disease  LVH/diastolic dysfunction  Peripheral venous insufficiency  - NYHA functional class I. Other than ankle swelling, no signs or symptoms of hypervolemia. Denies shortness of breath, orthopnea  - Lower extremity edema is more significant on Left- recent lower extremity ultrasound from 10/21 showed No evidence of deep vein thrombosis in the  left lower extremity - Patient has been taking 1/2 tablet of torsemide daily- 10 mg daily. Reports frequent urination on increased doses of torsemide  - Instructed patient  to increase dose of torsemide to 20 mg daily for the next 3 days to see if swelling improves. Then, resume 10 mg daily  - Patient is on potassium supplementation- unsure what dose she is taking. Ordered BMP today to assess K and renal function. Instructed her to check K dosing, and to let us know dose  - Discussed importance of low sodium diet. Also encouraged use of compression stockings and elevated feet whenever possible   RBBB - Chronic - No symptoms to suggest periods of high-grade AV block   Dispo: Follow up in 4 weeks with APP   Signed, Jonita Albee, PA-C

## 2022-12-05 ENCOUNTER — Ambulatory Visit: Payer: Medicare Other | Attending: Physician Assistant | Admitting: Cardiology

## 2022-12-05 ENCOUNTER — Encounter: Payer: Self-pay | Admitting: Cardiology

## 2022-12-05 VITALS — BP 148/82 | HR 64 | Ht 64.0 in | Wt 187.8 lb

## 2022-12-05 DIAGNOSIS — Z5181 Encounter for therapeutic drug level monitoring: Secondary | ICD-10-CM | POA: Diagnosis not present

## 2022-12-05 DIAGNOSIS — I872 Venous insufficiency (chronic) (peripheral): Secondary | ICD-10-CM | POA: Diagnosis not present

## 2022-12-05 DIAGNOSIS — I4510 Unspecified right bundle-branch block: Secondary | ICD-10-CM | POA: Diagnosis not present

## 2022-12-05 DIAGNOSIS — I10 Essential (primary) hypertension: Secondary | ICD-10-CM

## 2022-12-05 DIAGNOSIS — M79605 Pain in left leg: Secondary | ICD-10-CM | POA: Diagnosis not present

## 2022-12-05 DIAGNOSIS — R600 Localized edema: Secondary | ICD-10-CM

## 2022-12-05 DIAGNOSIS — Z79899 Other long term (current) drug therapy: Secondary | ICD-10-CM

## 2022-12-05 DIAGNOSIS — I119 Hypertensive heart disease without heart failure: Secondary | ICD-10-CM

## 2022-12-05 DIAGNOSIS — M7989 Other specified soft tissue disorders: Secondary | ICD-10-CM | POA: Diagnosis not present

## 2022-12-05 MED ORDER — HYDRALAZINE HCL 25 MG PO TABS: 25.0000 mg | ORAL_TABLET | Freq: Three times a day (TID) | ORAL | 3 refills | Status: DC

## 2022-12-05 MED ORDER — TORSEMIDE 20 MG PO TABS: 10.0000 mg | ORAL_TABLET | Freq: Every day | ORAL | 3 refills | Status: DC

## 2022-12-05 NOTE — Patient Instructions (Signed)
Medication Instructions:  Take an extra 10 mg of Torsemide for the next 3-4 days then go back down to to regular 10 mg. Start Hydralazine 25 mg 3 time a day. *If you need a refill on your cardiac medications before your next appointment, please call your pharmacy*   Lab Work: Bmp will be drawn today If you have labs (blood work) drawn today and your tests are completely normal, you will receive your results only by: MyChart Message (if you have MyChart) OR A paper copy in the mail If you have any lab test that is abnormal or we need to change your treatment, we will call you to review the results.   Testing/Procedures: No testing   Follow-Up: At [facility], you and your health needs are our priority.  As part of our continuing mission to provide you with exceptional heart care, we have created designated Provider Care Teams.  These Care Teams include your primary Cardiologist (physician) and Advanced Practice Providers (APPs -  Physician Assistants and Nurse Practitioners) who all work together to provide you with the care you need, when you need it.  We recommend signing up for the patient portal called "MyChart".  Sign up information is provided on this After Visit Summary.  MyChart is used to connect with patients for Virtual Visits (Telemedicine).  Patients are able to view lab/test results, encounter notes, upcoming appointments, etc.  Non-urgent messages can be sent to your provider as well.   To learn more about what you can do with MyChart, go to [email or web address].    Your next appointment:   4 week(s)  Provider:   Any APP Other Instructions Please bring blood pressure log to next appointment.

## 2022-12-06 LAB — BASIC METABOLIC PANEL
BUN/Creatinine Ratio: 17 (ref 12–28)
BUN: 16 mg/dL (ref 8–27)
CO2: 26 mmol/L (ref 20–29)
Calcium: 9.6 mg/dL (ref 8.7–10.3)
Chloride: 107 mmol/L — ABNORMAL HIGH (ref 96–106)
Creatinine, Ser: 0.92 mg/dL (ref 0.57–1.00)
Glucose: 109 mg/dL — ABNORMAL HIGH (ref 70–99)
Potassium: 4.6 mmol/L (ref 3.5–5.2)
Sodium: 143 mmol/L (ref 134–144)
eGFR: 61 mL/min/{1.73_m2} (ref >59)

## 2022-12-07 ENCOUNTER — Telehealth: Payer: Self-pay

## 2022-12-07 NOTE — Telephone Encounter (Signed)
-----   Message from Jonita Albee sent at 12/06/2022  1:26 PM EDT ----- Please tell patient that her kidney function and potassium are normal. No changes to medications at this time   Thanks KJ

## 2022-12-07 NOTE — Telephone Encounter (Signed)
Left message to call back  

## 2022-12-11 ENCOUNTER — Telehealth: Payer: Self-pay

## 2022-12-11 MED ORDER — HYDRALAZINE HCL 25 MG PO TABS: 25.0000 mg | ORAL_TABLET | Freq: Three times a day (TID) | ORAL | 3 refills | Status: DC

## 2022-12-11 NOTE — Telephone Encounter (Signed)
-----   Message from Jonita Albee sent at 12/06/2022  1:26 PM EDT ----- Please tell patient that her kidney function and potassium are normal. No changes to medications at this time   Thanks KJ

## 2022-12-11 NOTE — Telephone Encounter (Signed)
Called patient advised below they verbalized understanding Sent 30 day supply of hydralazine to [facility] for patient to get started.

## 2022-12-15 ENCOUNTER — Other Ambulatory Visit: Payer: Self-pay | Admitting: Family Medicine

## 2022-12-15 DIAGNOSIS — R600 Localized edema: Secondary | ICD-10-CM

## 2022-12-15 DIAGNOSIS — R590 Localized enlarged lymph nodes: Secondary | ICD-10-CM

## 2022-12-26 ENCOUNTER — Ambulatory Visit
Admission: RE | Admit: 2022-12-26 | Discharge: 2022-12-26 | Disposition: A | Discharge status: Home / Self Care | Payer: Medicare Other | Source: Ambulatory Visit | Attending: Family Medicine | Admitting: Family Medicine

## 2022-12-26 DIAGNOSIS — R600 Localized edema: Secondary | ICD-10-CM

## 2022-12-26 DIAGNOSIS — M7989 Other specified soft tissue disorders: Secondary | ICD-10-CM | POA: Diagnosis not present

## 2022-12-26 DIAGNOSIS — R590 Localized enlarged lymph nodes: Secondary | ICD-10-CM

## 2022-12-26 DIAGNOSIS — R599 Enlarged lymph nodes, unspecified: Secondary | ICD-10-CM | POA: Diagnosis not present

## 2022-12-26 MED ORDER — IOPAMIDOL (ISOVUE-300) INJECTION 61%
100.0000 mL | Freq: Once | INTRAVENOUS | Status: AC | PRN
  Administered 2022-12-26: 100 mL via INTRAVENOUS

## 2022-12-26 NOTE — Progress Notes (Signed)
Cardiology Office Note:  .   Date:  01/01/2023  ID:  Kaitlin [name], DOB 06-19-1934, MRN [id number] PCP: Noberto Retort, MD  [facility] Providers Cardiologist:  Thurmon Fair, MD   History of Present Illness: [name]   Kaitlin [name] is a 88 y.o. female with a past medical history of hypertension, hypertensive heart disease (LVH, diastolic dysfunction without clinical heart failure), peripheral venous insufficiency, chronic RBBB, obesity, borderline diabetes.  Patient is followed by Dr. Royann Shivers and presents today for a follow up for high blood pressure    Previously underwent echocardiogram in 2009 that showed normal LV function, impaired LV relaxation. Underwent nuclear stress test in 2009 that showed no evidence of inducible myocardial ischemia   Patient was last by Dr. Royann Shivers on 09/20/2022.  At that time, patient reported that though she was feeling well, her blood pressure had been high.  In office, her BP was elevated to 164/85.  Patient also reported having mild ankle swelling towards the end of the day that usually goes away by the next morning.  Patient remained on amlodipine, telmisartan, night-time clonidine.  She was started on spironolactone 25 mg daily for BP management and lower extremity edema.  I saw patient in the clinic on 10/29. She had stopped spironolactone due to itchiness. She continues to have lower extremity swelling, but did notice improvement with compression stockings and leg elevation. Her BP was elevated with systolic readings ranging from the 140s-160s at home. Started hydralazine 25 mg TID. Remained on torsemide 10 mg daily. She did not want to increase her torsemide because she had frequent urination on higher doses.   Today, patient presents for follow up on her blood pressure. She has been monitoring her BP at home, checking 2x per day. Reports some fluctuation in her blood pressure readings. Review of her BP log shows that her BP is predominantly in the  130s-140s systolic. There are a few reads in the 120s systolic, but a few in the 170s. These are outliers. Her diastolic BP predominantly in the 70s-80s. She denies dizziness, syncope, near syncope.   The patient also reported chronic leg swelling, which has not shown significant improvement. She has been using compression socks and taking torsemide to manage this condition. She does not notice much improvement when wearing compression stockings. In the past, she has been hesitant to increase her torsemide dose due to frequent urination. However, with her persistent lower extremity swelling, she is now willing to increase the dose of torsemide to potentially improve the swelling and control her blood pressure.  In addition to her BP and lower extremity swelling, the patient reported a decrease in her usual energy levels. She also mentioned ongoing issues with sciatica and spondylitis, which have been causing back pain. Her PCP recently ordered imaging to further investigate these issues.  Lastly, the patient mentioned a recent onset of throat soreness and hoarseness, which she attributed to possible dry air or a reaction to a nasal spray medication. She denied any associated breathing difficulties, fever, chills, or body aches.  ROS: Denies chest pain, dizziness, syncope, near syncope, palpitations, shortness of breath. Does have some soreness in her throat and nasal congestion   Studies Reviewed: .       Risk Assessment/Calculations:     HYPERTENSION CONTROL Vitals:   01/01/23 0839 01/01/23 1042  BP: (!) 168/84 (!) 160/90    The patient's blood pressure is elevated above target today.  In order to address the patient's elevated  BP: A current anti-hypertensive medication was adjusted today.          Physical Exam:   VS:  BP (!) 160/90   Pulse 75   Ht 5\' 4"  (1.626 m)   Wt 188 lb 9.6 oz (85.5 kg)   SpO2 96%   BMI 32.37 kg/m    Wt Readings from Last 3 Encounters:  01/01/23 188 lb 9.6  oz (85.5 kg)  12/05/22 187 lb 12.8 oz (85.2 kg)  09/26/22 182 lb 12.2 oz (82.9 kg)    GEN: Well nourished, well developed in no acute distress. Sitting comfortably on the exam table  NECK: No JVD  CARDIAC:  RRR, no murmurs, rubs, gallops. Radial pulses 2+ bilaterally  RESPIRATORY:  Clear to auscultation without rales, wheezing or rhonchi. Normal work of breathing on room air   ABDOMEN: Soft, non-tender, non-distended EXTREMITIES: 1+ edema in BLE, more significant in left>right ; No deformity   ASSESSMENT AND PLAN: .    HTN  - Goal BP 140/90. In the past, patient had issues with orthostatic hypotension but denies any recent dizziness, syncope, near syncope  - Currently on amlodipine 10 mg daily, telmisartan 80 mg daily, hydralazine 25 mg TID, torsemide 10 mg daily. Also takes clonidine 0.2 mg before bed, cannot take during the day as it causes fatigue. Note, she was started on hydralazine 25 mg TID last visit- reports some decrease in amount of urine produced, but otherwise is tolerating medication well. Willing to continue  - Review of home BP revealed that BP is predominantly in the 130-140s systolic, 80s-90s diastolic. Occasionally has some higher Bps into the 170s systolic, but these are outliers  - BP elevated to 160/90 today- note, she has not taken her hydralazine or torsemide yet today  - Not on BB due to resting bradycardia. Not on spironolactone as it caused itchiness  - Increase torsemide to 20 mg daily  - Continue amlodipine 10 mg daily, clonidine 0.2 mg nightly, hydralazine 25 mg TID, telmisartan 80 mg daily  - Continue home BP log - BMP from 10/29 showed creatinine 0.92, K 4.6. Ordered repeat BMP for 2 weeks after increasing torsemide    HTN heart disease  LVH/diastolic dysfunction  Peripheral venous insufficiency  - NYHA functional class I. Other than ankle swelling, no signs or symptoms of hypervolemia. Denies shortness of breath, orthopnea  - Lower extremity edema is  somewhat chronic at this point. She also notices that swelling is more significant on Left- recent lower extremity ultrasound from 11/27/22 showed No evidence of deep vein thrombosis in the  left lower extremity - Patient has been taking torsemide 10 mg daily. In the past, she had been hesitant to increase her torsemide due to concerns of frequent urination. After some discussion today, she is willing to try torsemide 20 mg daily  - Increase torsemide to 20 mg daily. Continue K supplementation  - Ordered BMP to be collected in 2 weeks. Creatinine was 0.92 and K 4.6 on 10/29  - Discussed importance of low sodium diet. Encouraged compression stockings and elevating feet    RBBB - Chronic - No symptoms to suggest periods of high-grade AV block    Dispo: Follow up in 3-4 weeks   Signed, Jonita Albee, PA-C

## 2023-01-01 ENCOUNTER — Encounter: Payer: Self-pay | Admitting: Cardiology

## 2023-01-01 ENCOUNTER — Ambulatory Visit: Payer: Medicare Other | Attending: Cardiology | Admitting: Cardiology

## 2023-01-01 VITALS — BP 160/90 | HR 75 | Ht 64.0 in | Wt 188.6 lb

## 2023-01-01 DIAGNOSIS — R600 Localized edema: Secondary | ICD-10-CM | POA: Diagnosis not present

## 2023-01-01 DIAGNOSIS — I872 Venous insufficiency (chronic) (peripheral): Secondary | ICD-10-CM

## 2023-01-01 DIAGNOSIS — I4510 Unspecified right bundle-branch block: Secondary | ICD-10-CM

## 2023-01-01 DIAGNOSIS — I10 Essential (primary) hypertension: Secondary | ICD-10-CM | POA: Diagnosis not present

## 2023-01-01 DIAGNOSIS — Z79899 Other long term (current) drug therapy: Secondary | ICD-10-CM

## 2023-01-01 MED ORDER — TORSEMIDE 20 MG PO TABS: 20.0000 mg | ORAL_TABLET | Freq: Every day | ORAL | 0 refills | Status: DC

## 2023-01-01 NOTE — Patient Instructions (Signed)
Medication Instructions:  Increase Torsemide to 20 mg once a day *If you need a refill on your cardiac medications before your next appointment, please call your pharmacy*  Lab Work: We will like for you to get BMP in 2 weeks If you have labs (blood work) drawn today and your tests are completely normal, you will receive your results only by: MyChart Message (if you have MyChart) OR A paper copy in the mail If you have any lab test that is abnormal or we need to change your treatment, we will call you to review the results.  Testing/Procedures: No testing  Follow-Up: At [facility], you and your health needs are our priority.  As part of our continuing mission to provide you with exceptional heart care, we have created designated Provider Care Teams.  These Care Teams include your primary Cardiologist (physician) and Advanced Practice Providers (APPs -  Physician Assistants and Nurse Practitioners) who all work together to provide you with the care you need, when you need it.  We recommend signing up for the patient portal called "MyChart".  Sign up information is provided on this After Visit Summary.  MyChart is used to connect with patients for Virtual Visits (Telemedicine).  Patients are able to view lab/test results, encounter notes, upcoming appointments, etc.  Non-urgent messages can be sent to your provider as well.   To learn more about what you can do with MyChart, go to [email or web address].    Your next appointment:   3-4 week(s)  Provider:   Any APP

## 2023-01-21 NOTE — Progress Notes (Unsigned)
Cardiology Office Note    Date:  01/22/2023  ID:  Mattison, Figurski May 18, 1934, MRN [id number] PCP:  Noberto Retort, MD  Cardiologist:  Thurmon Fair, MD  Electrophysiologist:  None   Chief Complaint: Follow up for hypertension   History of Present Illness: Kaitlin [name]    Kaitlin [name] is a 88 y.o. female with visit-pertinent history of hypertension, hypertensive heart disease, LVH, diastolic dysfunction without clinical heart failure, peripheral venous insufficiency, chronic RBBB, obesity, borderline diabetes.  She is followed by Dr. Royann Shivers.  In 2009 underwent echocardiogram that showed normal LV function, impaired LV relaxation.  Nuclear stress test at that time showed no evidence of inducible myocardial ischemia.  She was seen by Dr. Royann Shivers on 09/20/2022, she reported that she been feeling well but her blood pressure had been high.  In office her blood pressure was elevated to 1 6485.  She reported having mild ankle swelling towards the end of the day that would improve by the next morning.  She was started on spironolactone 25 mg daily for BP management and lower extremity edema.  She was seen back in clinic on 12/05/2022 by Robet Leu, PA.  She had reportedly stopped spironolactone due to itchiness.  She continued to report lower extremity swelling that is improvement with compression stockings and leg elevation.  She was started on hydralazine 25 mg 3 times daily and continued on torsemide 10 mg daily.   She was last seen in office on 01/01/2023 for follow-up for her blood pressure.  She reported some fluctuations in her blood pressure readings on review of her blood pressure log it was noted that she was predominantly in the 130s to 140s systolic she had a few readings in the 120s as well as some in the 170s.  She reported chronic leg swelling and her torsemide was increased to 20 mg daily.  Patient also reported ongoing issues with sciatica and spondylitis that her PCP had been  monitoring.  Today she presents for follow-up.  She reports that she is doing very well.  She reports that her blood pressure has been significantly better since starting on hydralazine and increased dose of torsemide.  She reports that at home her blood pressure has been averaging 130-140 over 70-80.  She notes that she has been tolerating all of her medications well, she denies any dizziness, lightheadedness, presyncope or syncope.  She denies chest pain or increased shortness of breath.  She notes that her lower extremity edema is slightly improved.  She continues to do water aerobics, she enjoys this and tolerates it very well.  ROS: .   Today she denies chest pain, shortness of breath, lower extremity edema, fatigue, palpitations, melena, hematuria, hemoptysis, diaphoresis, weakness, presyncope, syncope, orthopnea, and PND.  All other systems are reviewed and otherwise negative.  Studies Reviewed: Kaitlin [name]    EKG:  EKG is not ordered today.   Current Reported Medications:.    Current Meds  Medication Sig   Acetaminophen (TYLENOL 8 HOUR PO) Take 2 tablets by mouth 3 times/day as needed-between meals & bedtime.   amLODipine (NORVASC) 10 MG tablet Take 10 mg by mouth daily.   aspirin 81 MG tablet Take 81 mg by mouth daily.   cloNIDine (CATAPRES) 0.2 MG tablet Take 0.2 mg by mouth daily.   fluticasone (FLONASE) 50 MCG/ACT nasal spray Place 2 sprays into both nostrils 3 times/day as needed-between meals & bedtime.   hydrALAZINE (APRESOLINE) 25 MG tablet Take 1 tablet (25 mg  total) by mouth 3 (three) times daily.   Multiple Vitamins-Minerals (CENTRUM SILVER WOMEN 50+ PO) Take 1 tablet by mouth daily in the afternoon.   potassium chloride (KLOR-CON) 10 MEQ tablet Take 20 mEq by mouth daily.   telmisartan (MICARDIS) 80 MG tablet Take 80 mg by mouth daily.   torsemide (DEMADEX) 20 MG tablet Take 1 tablet (20 mg total) by mouth daily.   traMADol (ULTRAM) 50 MG tablet Take 50 mg by mouth daily.    Physical Exam:    VS:  BP 136/78   Pulse 74   Ht 5\' 4"  (1.626 m)   Wt 186 lb 6.4 oz (84.6 kg)   SpO2 100%   BMI 32.00 kg/m    Wt Readings from Last 3 Encounters:  01/22/23 186 lb 6.4 oz (84.6 kg)  01/01/23 188 lb 9.6 oz (85.5 kg)  12/05/22 187 lb 12.8 oz (85.2 kg)    GEN: Well nourished, well developed in no acute distress NECK: No JVD; No carotid bruits CARDIAC: RRR, no murmurs, rubs, gallops RESPIRATORY:  Clear to auscultation without rales, wheezing or rhonchi  ABDOMEN: Soft, non-tender, non-distended EXTREMITIES:  No edema; No acute deformity   Asessement and Plan:Kaitlin [name]    Hypertension: Blood pressure goal less than 140/90.  Blood pressure today 136/78. In the past she has had problems with orthostatic hypotension. Unfortunately she left her blood pressure log at home today but reports that her blood pressures have been consistently 130-140 over 70-80.  She has been tolerating her blood pressure medications well, she denies any dizziness or lightheadedness.  Of note she cannot tolerate spironolactone, caused itching.  Encouraged to continue monitoring her blood pressures at home.  Continue amlodipine 10 mg daily, clonidine 0.2 mg nightly, hydralazine 25 mg 3 times daily and telmisartan 80 mg daily. Check BMET.   LVH/diastolic dysfunction/venous insufficiency: Noted to have chronic lower extremity edema. Reports that swelling was more significant on the left, lower extremity ultrasound from 11/27/2022 showed no evidence of DVT.  Today she notes some improvement in lower extremity edema with increased torsemide.  On exam she has mild bilateral lower extremity edema. Discussed importance of low-sodium diet and encouraged compression stockings as well as elevating feet when able. Continue torsemide 20 mg daily. Check BMET.   RBBB: Noted to be chronic. She denies any symptoms suggestive of high-grade AV block.    Disposition: F/u with Robet Leu, PA in 3-4 months.    Signed, Rip Harbour, NP

## 2023-01-22 ENCOUNTER — Ambulatory Visit: Payer: Medicare Other | Attending: Cardiology | Admitting: Cardiology

## 2023-01-22 ENCOUNTER — Encounter: Payer: Self-pay | Admitting: Cardiology

## 2023-01-22 VITALS — BP 136/78 | HR 74 | Ht 64.0 in | Wt 186.4 lb

## 2023-01-22 DIAGNOSIS — R600 Localized edema: Secondary | ICD-10-CM | POA: Diagnosis not present

## 2023-01-22 DIAGNOSIS — Z79899 Other long term (current) drug therapy: Secondary | ICD-10-CM | POA: Diagnosis not present

## 2023-01-22 DIAGNOSIS — I119 Hypertensive heart disease without heart failure: Secondary | ICD-10-CM

## 2023-01-22 DIAGNOSIS — I10 Essential (primary) hypertension: Secondary | ICD-10-CM | POA: Diagnosis not present

## 2023-01-22 DIAGNOSIS — I4510 Unspecified right bundle-branch block: Secondary | ICD-10-CM | POA: Diagnosis not present

## 2023-01-22 DIAGNOSIS — I872 Venous insufficiency (chronic) (peripheral): Secondary | ICD-10-CM

## 2023-01-22 LAB — BASIC METABOLIC PANEL
BUN/Creatinine Ratio: 19 (ref 12–28)
BUN: 20 mg/dL (ref 8–27)
CO2: 24 mmol/L (ref 20–29)
Calcium: 10.1 mg/dL (ref 8.7–10.3)
Chloride: 106 mmol/L (ref 96–106)
Creatinine, Ser: 1.07 mg/dL — ABNORMAL HIGH (ref 0.57–1.00)
Glucose: 103 mg/dL — ABNORMAL HIGH (ref 70–99)
Potassium: 5 mmol/L (ref 3.5–5.2)
Sodium: 145 mmol/L — ABNORMAL HIGH (ref 134–144)
eGFR: 51 mL/min/{1.73_m2} — ABNORMAL LOW (ref >59)

## 2023-01-22 NOTE — Patient Instructions (Addendum)
Medication Instructions:  No changes *If you need a refill on your cardiac medications before your next appointment, please call your pharmacy*  Lab Work: Bmet will be drawn today If you have labs (blood work) drawn today and your tests are completely normal, you will receive your results only by: MyChart Message (if you have MyChart) OR A paper copy in the mail If you have any lab test that is abnormal or we need to change your treatment, we will call you to review the results.  Testing/Procedures: No testing  Follow-Up: At [facility], you and your health needs are our priority.  As part of our continuing mission to provide you with exceptional heart care, we have created designated Provider Care Teams.  These Care Teams include your primary Cardiologist (physician) and Advanced Practice Providers (APPs -  Physician Assistants and Nurse Practitioners) who all work together to provide you with the care you need, when you need it.  We recommend signing up for the patient portal called "MyChart".  Sign up information is provided on this After Visit Summary.  MyChart is used to connect with patients for Virtual Visits (Telemedicine).  Patients are able to view lab/test results, encounter notes, upcoming appointments, etc.  Non-urgent messages can be sent to your provider as well.   To learn more about what you can do with MyChart, go to [email or web address].    Your next appointment:   3-4 month(s)  Provider:   Robet Leu, PA-C

## 2023-01-23 ENCOUNTER — Telehealth: Payer: Self-pay

## 2023-01-23 NOTE — Telephone Encounter (Signed)
-----   Message from Rip Harbour sent at 01/23/2023  7:45 AM EST ----- Please let Kaitlin [name] know that her kidney function had a very slight decrease since increase of Torsemide, please remind her to stay hydrated with water. Her electrolytes are overall normal. Continue current medications and follow up as planned.

## 2023-01-23 NOTE — Telephone Encounter (Signed)
Called patient advised of below they verbalized understanding.

## 2023-04-04 DIAGNOSIS — E7800 Pure hypercholesterolemia, unspecified: Secondary | ICD-10-CM | POA: Diagnosis not present

## 2023-04-04 DIAGNOSIS — Z23 Encounter for immunization: Secondary | ICD-10-CM | POA: Diagnosis not present

## 2023-04-04 DIAGNOSIS — N183 Chronic kidney disease, stage 3 unspecified: Secondary | ICD-10-CM | POA: Diagnosis not present

## 2023-04-04 DIAGNOSIS — E1142 Type 2 diabetes mellitus with diabetic polyneuropathy: Secondary | ICD-10-CM | POA: Diagnosis not present

## 2023-04-04 DIAGNOSIS — I10 Essential (primary) hypertension: Secondary | ICD-10-CM | POA: Diagnosis not present

## 2023-04-04 DIAGNOSIS — I5032 Chronic diastolic (congestive) heart failure: Secondary | ICD-10-CM | POA: Diagnosis not present

## 2023-04-04 DIAGNOSIS — M48062 Spinal stenosis, lumbar region with neurogenic claudication: Secondary | ICD-10-CM | POA: Diagnosis not present

## 2023-04-04 DIAGNOSIS — Z0000 Encounter for general adult medical examination without abnormal findings: Secondary | ICD-10-CM | POA: Diagnosis not present

## 2023-04-04 DIAGNOSIS — I872 Venous insufficiency (chronic) (peripheral): Secondary | ICD-10-CM | POA: Diagnosis not present

## 2023-04-16 NOTE — Progress Notes (Signed)
 Cardiology Office Note:  .   Date:  04/23/2023  ID:  Kaitlin [name], DOB 1934/07/07, MRN [id number] PCP: Kaitlin Retort, MD  [facility] Providers Cardiologist:  Kaitlin Fair, MD   History of Present Illness: Kaitlin [name]   Kaitlin [name] is a 89 y.o. female with a past medical history of hypertension, hypertensive heart disease (LVH, diastolic dysfunction without clinical heart failure), peripheral venous insufficiency, chronic RBBB, obesity, borderline diabetes.  Patient is followed by Dr. Royann [name] and presents today for a follow up for high blood pressure    Previously underwent echocardiogram in 2009 that showed normal LV function, impaired LV relaxation. Underwent nuclear stress test in 2009 that showed no evidence of inducible myocardial ischemia   Patient was last seen by Dr. Royann [name] on 09/20/2022.  At that time,, her blood pressure had been high.  In office, her BP was elevated to 164/85.  Patient also reported having mild ankle swelling towards the end of the day that usually goes away by the next morning.  Patient remained on amlodipine, telmisartan, night-time clonidine.  She was started on spironolactone 25 mg daily for BP management and lower extremity edema.   I saw patient in the clinic on 10/29. She had stopped spironolactone due to itchiness. She continued to have lower extremity swelling, but did notice improvement with compression stockings and leg elevation. Her BP was elevated with systolic readings ranging from the 140s-160s at home. Started hydralazine 25 mg TID. Remained on torsemide 10 mg daily. She did not want to increase her torsemide because she had frequent urination on higher doses. I later saw her on 11/25 and her BP remained elevated. Torsemide was increased to 20 mg daily. When last seen by cardiology on 01/22/23, she was doing well.   Today, patient presents for a three-month follow-up. She reports that her blood pressure has been well-controlled at home, typically in  the range of 130s to 140s systolic, rarely reaching the 034V. She has been monitoring her blood pressure at home and has not experienced any episodes of dizziness or fainting. She is currently on hydralazine, which she reports is working well for her. She also mentions a previous adverse reaction to spironolactone, which caused itching, and a beta-blocker that lowered her heart rate too much.  In addition to her blood pressure, the patient has been managing lower extremity swelling. She reports that her swelling has significantly improved, as evidenced by her ability to wear sandals comfortably. She is currently on torsemide for this issue. She expresses a desire to prevent future swelling and is following a low-salt diet, elevating her feet when resting, and wearing compression socks.  The patient also mentions having chronic kidney disease, which is stable according to recent lab results. She is not currently seeing a kidney specialist. She also has sciatica, which causes pain in her lower back and down her leg. She has not found relief from injections for this issue.  The patient is active and has recently started a chair yoga class, which she feels is helping with her breathing. She is considering returning to water aerobics, which she stopped after her blood pressure issues began.  ROS: Patient denies chest pain, shortness of breath, syncope, near syncope, dizziness. Ankle swelling is improving  Studies Reviewed: .       Risk Assessment/Calculations:     Physical Exam:   VS:  BP (!) 144/74 (BP Location: Left Arm, Patient Position: Sitting, Cuff Size: Normal)   Pulse 74  Ht 5\' 4"  (1.626 m)   Wt 185 lb (83.9 kg)   SpO2 96%   BMI 31.76 kg/m    Wt Readings from Last 3 Encounters:  04/23/23 185 lb (83.9 kg)  01/22/23 186 lb 6.4 oz (84.6 kg)  01/01/23 188 lb 9.6 oz (85.5 kg)    GEN: Well nourished, well developed in no acute distress. Sitting comfortably in the chair  NECK: No JVD; No  carotid bruits CARDIAC: RRR, no murmurs, rubs, gallops. Radial pulses 2+ bilaterally  RESPIRATORY:  Clear to auscultation without rales, wheezing or rhonchi. Normal WOB on room air  ABDOMEN: Soft, non-tender, non-distended EXTREMITIES:  Trace edema in BLE; No deformity   ASSESSMENT AND PLAN: .    HTN  - Goal BP 140/90. In the past, patient had issues with orthostatic hypotension but denies any recent dizziness, syncope, near syncope  - Not on BB due to resting bradycardia. Not on spironolactone as it caused itchiness  - Patient reports that her BP has overall been well controlled at home. Usually in the 130s-140s/80s. Sometimes reaches 150s systolic, but this is rare. 144/74 today in clinic  - Discussed low sodium diets, increasing physical activity as tolerated. She has been doing chair yoga and wants to resume water aerobics  - Continue amlodipine 10 mg daily, clonidine 0.2 mg every evening, hydralazine 25 mg TID, telmisartan 80 mg daily, torsemide 20 mg daily  - Creatinine 1.07 in 01/2023, potassium 4.6 in 03/2023    HTN heart disease  LVH/diastolic dysfunction  Peripheral venous insufficiency  - NYHA functional class I. Other than ankle swelling, no signs or symptoms of hypervolemia. Denies shortness of breath, orthopnea  - Ankle edema has improved and is now trace in BLE - Continue torsemide 20 mg daily. Continue K supplementation  - Creatinine 1.07 in 01/2023, K 4.6 in 03/2023  - Discussed importance of low sodium diet. Encouraged compression stockings and elevating feet    RBBB - Chronic - No symptoms to suggest periods of high-grade AV block    Dispo: Follow up in 4-5 months with Dr. Royann [name]   Signed, Kaitlin Albee, PA-C

## 2023-04-23 ENCOUNTER — Ambulatory Visit: Payer: Medicare Other | Attending: Cardiology | Admitting: Cardiology

## 2023-04-23 ENCOUNTER — Encounter: Payer: Self-pay | Admitting: Cardiology

## 2023-04-23 VITALS — BP 144/74 | HR 74 | Ht 64.0 in | Wt 185.0 lb

## 2023-04-23 DIAGNOSIS — I10 Essential (primary) hypertension: Secondary | ICD-10-CM

## 2023-04-23 DIAGNOSIS — I4510 Unspecified right bundle-branch block: Secondary | ICD-10-CM

## 2023-04-23 DIAGNOSIS — I119 Hypertensive heart disease without heart failure: Secondary | ICD-10-CM

## 2023-04-23 DIAGNOSIS — I872 Venous insufficiency (chronic) (peripheral): Secondary | ICD-10-CM | POA: Diagnosis not present

## 2023-04-23 NOTE — Patient Instructions (Signed)
 Medication Instructions:  No changes *If you need a refill on your cardiac medications before your next appointment, please call your pharmacy*  Lab Work: No labs If you have labs (blood work) drawn today and your tests are completely normal, you will receive your results only by: MyChart Message (if you have MyChart) OR A paper copy in the mail If you have any lab test that is abnormal or we need to change your treatment, we will call you to review the results.  Testing/Procedures: No testing  Follow-Up: At [facility], you and your health needs are our priority.  As part of our continuing mission to provide you with exceptional heart care, we have created designated Provider Care Teams.  These Care Teams include your primary Cardiologist (physician) and Advanced Practice Providers (APPs -  Physician Assistants and Nurse Practitioners) who all work together to provide you with the care you need, when you need it.  We recommend signing up for the patient portal called "MyChart".  Sign up information is provided on this After Visit Summary.  MyChart is used to connect with patients for Virtual Visits (Telemedicine).  Patients are able to view lab/test results, encounter notes, upcoming appointments, etc.  Non-urgent messages can be sent to your provider as well.   To learn more about what you can do with MyChart, go to [email or web address].    Your next appointment:   4-5 month(s)  Provider:   Thurmon Fair, MD    Other Instructions

## 2023-05-06 ENCOUNTER — Other Ambulatory Visit: Payer: Self-pay | Admitting: Cardiology

## 2023-05-09 DIAGNOSIS — J3489 Other specified disorders of nose and nasal sinuses: Secondary | ICD-10-CM | POA: Diagnosis not present

## 2023-05-09 DIAGNOSIS — J0100 Acute maxillary sinusitis, unspecified: Secondary | ICD-10-CM | POA: Diagnosis not present

## 2023-05-09 DIAGNOSIS — R051 Acute cough: Secondary | ICD-10-CM | POA: Diagnosis not present

## 2023-05-09 DIAGNOSIS — R0981 Nasal congestion: Secondary | ICD-10-CM | POA: Diagnosis not present

## 2023-06-01 DIAGNOSIS — Z1231 Encounter for screening mammogram for malignant neoplasm of breast: Secondary | ICD-10-CM | POA: Diagnosis not present

## 2023-06-05 DIAGNOSIS — J302 Other seasonal allergic rhinitis: Secondary | ICD-10-CM | POA: Diagnosis not present

## 2023-08-06 DIAGNOSIS — I5032 Chronic diastolic (congestive) heart failure: Secondary | ICD-10-CM | POA: Diagnosis not present

## 2023-08-06 DIAGNOSIS — I10 Essential (primary) hypertension: Secondary | ICD-10-CM | POA: Diagnosis not present

## 2023-08-06 DIAGNOSIS — N183 Chronic kidney disease, stage 3 unspecified: Secondary | ICD-10-CM | POA: Diagnosis not present

## 2023-08-06 DIAGNOSIS — E7800 Pure hypercholesterolemia, unspecified: Secondary | ICD-10-CM | POA: Diagnosis not present

## 2023-09-06 DIAGNOSIS — N183 Chronic kidney disease, stage 3 unspecified: Secondary | ICD-10-CM | POA: Diagnosis not present

## 2023-09-06 DIAGNOSIS — I5032 Chronic diastolic (congestive) heart failure: Secondary | ICD-10-CM | POA: Diagnosis not present

## 2023-09-06 DIAGNOSIS — I10 Essential (primary) hypertension: Secondary | ICD-10-CM | POA: Diagnosis not present

## 2023-09-06 DIAGNOSIS — E7800 Pure hypercholesterolemia, unspecified: Secondary | ICD-10-CM | POA: Diagnosis not present

## 2023-10-07 DIAGNOSIS — I5032 Chronic diastolic (congestive) heart failure: Secondary | ICD-10-CM | POA: Diagnosis not present

## 2023-10-07 DIAGNOSIS — I10 Essential (primary) hypertension: Secondary | ICD-10-CM | POA: Diagnosis not present

## 2023-10-07 DIAGNOSIS — E7800 Pure hypercholesterolemia, unspecified: Secondary | ICD-10-CM | POA: Diagnosis not present

## 2023-10-07 DIAGNOSIS — N183 Chronic kidney disease, stage 3 unspecified: Secondary | ICD-10-CM | POA: Diagnosis not present

## 2023-10-17 ENCOUNTER — Other Ambulatory Visit: Payer: Self-pay | Admitting: Cardiology

## 2023-11-02 DIAGNOSIS — I10 Essential (primary) hypertension: Secondary | ICD-10-CM | POA: Diagnosis not present

## 2023-11-02 DIAGNOSIS — E7800 Pure hypercholesterolemia, unspecified: Secondary | ICD-10-CM | POA: Diagnosis not present

## 2023-11-02 DIAGNOSIS — I5032 Chronic diastolic (congestive) heart failure: Secondary | ICD-10-CM | POA: Diagnosis not present

## 2023-11-02 DIAGNOSIS — E1142 Type 2 diabetes mellitus with diabetic polyneuropathy: Secondary | ICD-10-CM | POA: Diagnosis not present

## 2023-11-02 DIAGNOSIS — N1831 Chronic kidney disease, stage 3a: Secondary | ICD-10-CM | POA: Diagnosis not present

## 2023-11-26 DIAGNOSIS — I10 Essential (primary) hypertension: Secondary | ICD-10-CM | POA: Diagnosis not present

## 2023-11-26 DIAGNOSIS — M7989 Other specified soft tissue disorders: Secondary | ICD-10-CM | POA: Diagnosis not present

## 2023-11-26 DIAGNOSIS — R7303 Prediabetes: Secondary | ICD-10-CM | POA: Diagnosis not present

## 2023-11-26 DIAGNOSIS — Z9109 Other allergy status, other than to drugs and biological substances: Secondary | ICD-10-CM | POA: Diagnosis not present

## 2023-11-26 DIAGNOSIS — R011 Cardiac murmur, unspecified: Secondary | ICD-10-CM | POA: Diagnosis not present

## 2023-11-26 DIAGNOSIS — M5430 Sciatica, unspecified side: Secondary | ICD-10-CM | POA: Diagnosis not present

## 2023-12-06 DIAGNOSIS — H43813 Vitreous degeneration, bilateral: Secondary | ICD-10-CM | POA: Diagnosis not present

## 2023-12-06 DIAGNOSIS — Z961 Presence of intraocular lens: Secondary | ICD-10-CM | POA: Diagnosis not present

## 2023-12-06 DIAGNOSIS — H04123 Dry eye syndrome of bilateral lacrimal glands: Secondary | ICD-10-CM | POA: Diagnosis not present

## 2023-12-06 DIAGNOSIS — H40013 Open angle with borderline findings, low risk, bilateral: Secondary | ICD-10-CM | POA: Diagnosis not present

## 2024-03-04 ENCOUNTER — Other Ambulatory Visit: Payer: Self-pay | Admitting: Cardiovascular Disease

## 2024-04-04 ENCOUNTER — Ambulatory Visit: Admitting: Cardiovascular Disease
